# Patient Record
Sex: Female | Born: 1965
Health system: Southern US, Community
[De-identification: ages and names within clinical notes are randomized; demographics above are authoritative.]

## PROBLEM LIST (undated history)

## (undated) DIAGNOSIS — I213 ST elevation (STEMI) myocardial infarction of unspecified site: Secondary | ICD-10-CM

## (undated) DIAGNOSIS — K635 Polyp of colon: Secondary | ICD-10-CM

## (undated) DIAGNOSIS — Z95810 Presence of automatic (implantable) cardiac defibrillator: Secondary | ICD-10-CM

## (undated) DIAGNOSIS — I519 Heart disease, unspecified: Secondary | ICD-10-CM

## (undated) DIAGNOSIS — I2510 Atherosclerotic heart disease of native coronary artery without angina pectoris: Secondary | ICD-10-CM

## (undated) DIAGNOSIS — I5189 Other ill-defined heart diseases: Secondary | ICD-10-CM

## (undated) HISTORY — DX: Polyp of colon: K63.5

## (undated) HISTORY — PX: AUGMENTATION MAMMAPLASTY: SUR837

---

## 1998-01-30 HISTORY — PX: TUBAL LIGATION: SHX77

## 1998-07-08 ENCOUNTER — Encounter: Admission: RE | Admit: 1998-07-08 | Discharge: 1998-10-06 | Payer: Self-pay | Admitting: Obstetrics and Gynecology

## 1998-11-03 ENCOUNTER — Inpatient Hospital Stay (HOSPITAL_COMMUNITY): Admission: AD | Admit: 1998-11-03 | Discharge: 1998-11-05 | Payer: Self-pay | Admitting: Obstetrics and Gynecology

## 1998-11-05 ENCOUNTER — Encounter (HOSPITAL_COMMUNITY): Admission: RE | Admit: 1998-11-05 | Discharge: 1999-02-03 | Payer: Self-pay | Admitting: Obstetrics and Gynecology

## 1999-02-02 ENCOUNTER — Other Ambulatory Visit: Admission: RE | Admit: 1999-02-02 | Discharge: 1999-02-02 | Payer: Self-pay | Admitting: Obstetrics and Gynecology

## 2000-07-09 ENCOUNTER — Other Ambulatory Visit: Admission: RE | Admit: 2000-07-09 | Discharge: 2000-07-09 | Payer: Self-pay | Admitting: Obstetrics and Gynecology

## 2002-03-18 ENCOUNTER — Other Ambulatory Visit: Admission: RE | Admit: 2002-03-18 | Discharge: 2002-03-18 | Payer: Self-pay | Admitting: Obstetrics and Gynecology

## 2003-06-02 ENCOUNTER — Other Ambulatory Visit: Admission: RE | Admit: 2003-06-02 | Discharge: 2003-06-02 | Payer: Self-pay | Admitting: Obstetrics and Gynecology

## 2011-02-15 ENCOUNTER — Encounter: Payer: Self-pay | Admitting: Gastroenterology

## 2011-02-21 ENCOUNTER — Encounter: Payer: Self-pay | Admitting: Gastroenterology

## 2011-02-21 ENCOUNTER — Ambulatory Visit (INDEPENDENT_AMBULATORY_CARE_PROVIDER_SITE_OTHER): Payer: 59 | Admitting: Gastroenterology

## 2011-02-21 VITALS — BP 100/60 | HR 64 | Ht 60.0 in | Wt 130.4 lb

## 2011-02-21 DIAGNOSIS — K644 Residual hemorrhoidal skin tags: Secondary | ICD-10-CM

## 2011-02-21 DIAGNOSIS — K921 Melena: Secondary | ICD-10-CM

## 2011-02-21 NOTE — Progress Notes (Signed)
History of Present Illness: This is a 46 year old female who relates the onset of rectal pain and swelling that began 10 days ago. This was associated with small amounts of bright red blood per rectum. She presumed this was a hemorrhoid and began treatment with Advil, Preparation H cream and suppositories and her symptoms have steadily improved. She has a history of chronic constipation and takes MiraLax every 2-3 days and occasional stool softeners. She has had hemorrhoidal swelling and discomfort in the past however it had not been this severe. Denies weight loss, abdominal pain, constipation, diarrhea, change in stool caliber, melena, nausea, vomiting, dysphagia, reflux symptoms, chest pain.  Allergies  Allergen Reactions  . Monistat Other (See Comments)    Abrastion feeling   No outpatient prescriptions prior to visit.   Past Medical History  Diagnosis Date  . Hemorrhoids   . Vitamin D deficiency    Past Surgical History  Procedure Date  . Cesarean section 1993, 2000   History   Social History  . Marital Status: Single    Spouse Name: N/A    Number of Children: 2  . Years of Education: N/A   Occupational History  . Print production planner    Social History Main Topics  . Smoking status: Never Smoker   . Smokeless tobacco: Never Used  . Alcohol Use: No  . Drug Use: No  . Sexually Active: None   Other Topics Concern  . None   Social History Narrative  . None   Family History  Problem Relation Age of Onset  . Colon polyps Mother     Review of Systems: Pertinent positive and negative review of systems were noted in the above HPI section. All other review of systems were otherwise negative.   Physical Exam: General: Well developed , well nourished, no acute distress Head: Normocephalic and atraumatic Eyes:  sclerae anicteric, EOMI Ears: Normal auditory acuity Mouth: No deformity or lesions Neck: Supple, no masses or thyromegaly Lungs: Clear throughout to  auscultation Heart: Regular rate and rhythm; no murmurs, rubs or bruits Abdomen: Soft, non tender and non distended. No masses, hepatosplenomegaly or hernias noted. Normal Bowel sounds Rectal: Thrombosed external hemorrhoid is mildly tender, no internal lesions, Hemoccult-negative stool in the vault Musculoskeletal: Symmetrical with no gross deformities  Skin: No lesions on visible extremities Pulses:  Normal pulses noted Extremities: No clubbing, cyanosis, edema or deformities noted Neurological: Alert oriented x 4, grossly nonfocal Cervical Nodes:  No significant cervical adenopathy Inguinal Nodes: No significant inguinal adenopathy Psychological:  Alert and cooperative. Normal mood and affect  Assessment and Recommendations:  1. Hematochezia and a thrombosed external hemorrhoid. Begin Analpram HC cream twice a day along with standard rectal and hemorrhoidal care instructions. Rule out colorectal neoplasms leading to hematochezia but I suspect hematochezia is secondary to hemorrhoids. The risks, benefits, and alternatives to colonoscopy with possible biopsy, possible destruction of internal hemorrhoids and possible polypectomy were discussed with the patient and they consent to proceed.   2. Family history of colon polyps in her mother. Colonoscopy as above.  3. Chronic constipation. Continue current management with a high fiber diet and MiraLax as needed.

## 2011-02-21 NOTE — Patient Instructions (Addendum)
You have been scheduled for a Colonoscopy. See separate instructions. You have been given samples of Analpram to use rectally twice daily. Rectal care instructions given to patient. cc: [name], [place]

## 2011-02-22 ENCOUNTER — Encounter: Payer: Self-pay | Admitting: Gastroenterology

## 2011-02-22 ENCOUNTER — Ambulatory Visit (AMBULATORY_SURGERY_CENTER): Payer: 59 | Admitting: Gastroenterology

## 2011-02-22 DIAGNOSIS — D126 Benign neoplasm of colon, unspecified: Secondary | ICD-10-CM

## 2011-02-22 DIAGNOSIS — K635 Polyp of colon: Secondary | ICD-10-CM

## 2011-02-22 DIAGNOSIS — K921 Melena: Secondary | ICD-10-CM

## 2011-02-22 DIAGNOSIS — K644 Residual hemorrhoidal skin tags: Secondary | ICD-10-CM

## 2011-02-22 MED ORDER — SODIUM CHLORIDE 0.9 % IV SOLN: 500.0000 mL | INTRAVENOUS | Status: DC

## 2011-02-22 NOTE — Op Note (Signed)
[facility] [address] [place], [place]  [address]  COLONOSCOPY PROCEDURE REPORT  PATIENT:  Misty, Griffin  MR#:  [id number] BIRTHDATE:  12/24/1965, 45 yrs. old  GENDER:  female ENDOSCOPIST:  Judie Petit T. Russella Dar, MD, [facility]  PROCEDURE DATE:  02/22/2011 PROCEDURE:  Colonoscopy with polypectomy and submucosal injection ASA CLASS:  Class I INDICATIONS:  1) hematochezia MEDICATIONS:   MAC sedation, administered by CRNA, propofol (Diprivan) 250 mg IV DESCRIPTION OF PROCEDURE:   After the risks benefits and alternatives of the procedure were thoroughly explained, informed consent was obtained.  Digital rectal exam was performed and revealed no abnormalities.   The LB CF-[id number] endoscope was introduced through the anus and advanced to the cecum, which was identified by both the appendix and ileocecal valve, without limitations.  The quality of the prep was excellent, using MoviPrep.  The instrument was then slowly withdrawn as the colon was fully examined. <<PROCEDUREIMAGES>> FINDINGS:  A pedunculated polyp was found in the descending colon. It was 2 cm in size. Polyp was snared, then cauterized with monopolar cautery removed piecemeal. Retrieval was successful. Normal colon otherwise in the descending colon with submucosal injections of [place] ink 5 cm distal to polypectomy site. Otherwise normal colonoscopy without other polyps, masses, vascular ectasias, or inflammatory changes.   Retroflexed views in the rectum revealed internal hemorrhoids, small.  The time to cecum = 3.25  minutes. The scope was then withdrawn (time =  14.5  min) from the patient and the procedure completed.  COMPLICATIONS:  None  ENDOSCOPIC IMPRESSION: 1) 2 cm pedunculated polyp in the descending colon 2) Internal hemorrhoids  RECOMMENDATIONS: 1) Hold aspirin, aspirin products, and anti-inflammatory medication for 2 weeks. 2) Await pathology results 3) Repeat Colonoscopy in 1-2 years  pending pathology review.  Venita Lick. Russella Dar, MD, [name]  CC:  [name] PA  n. Rosalie DoctorVenita Lick. Stark at 02/22/2011 12:09 PM  Virgie Dad, [phone]

## 2011-02-22 NOTE — Progress Notes (Signed)
Patient did not experience any of the following events: a burn prior to discharge; a fall within the facility; wrong site/side/patient/procedure/implant event; or a hospital transfer or hospital admission upon discharge from the facility. [phone]) Patient did not have preoperative order for IV antibiotic SSI prophylaxis. [phone] Patient did not experience a hospital transfer or hospital admission upon discharge from [facility]. [phone])

## 2011-02-22 NOTE — Patient Instructions (Signed)
Resume all medications. Do Not Take any ASPIRIN PRODUCTS ADVIL OR ARTHRITIS MEDICATIONS FOR 2 WEEKS MAY USE TYLENOL.

## 2011-02-23 ENCOUNTER — Telehealth: Payer: Self-pay | Admitting: *Deleted

## 2011-02-23 NOTE — Telephone Encounter (Signed)
Left message to call us back

## 2011-02-28 ENCOUNTER — Encounter: Payer: Self-pay | Admitting: Gastroenterology

## 2012-04-23 DIAGNOSIS — I5189 Other ill-defined heart diseases: Secondary | ICD-10-CM

## 2012-04-23 HISTORY — DX: Other ill-defined heart diseases: I51.89

## 2013-01-10 ENCOUNTER — Encounter: Payer: Self-pay | Admitting: Gastroenterology

## 2013-03-30 HISTORY — PX: CORONARY ANGIOPLASTY WITH STENT PLACEMENT: SHX49

## 2013-04-22 ENCOUNTER — Encounter (HOSPITAL_COMMUNITY)
Admission: EM | Disposition: A | Discharge status: Home / Self Care | Payer: PRIVATE HEALTH INSURANCE | Source: Home / Self Care | Attending: Cardiology

## 2013-04-22 ENCOUNTER — Encounter (HOSPITAL_COMMUNITY): Payer: Self-pay | Admitting: Emergency Medicine

## 2013-04-22 ENCOUNTER — Emergency Department (HOSPITAL_COMMUNITY): Payer: PRIVATE HEALTH INSURANCE

## 2013-04-22 ENCOUNTER — Inpatient Hospital Stay (HOSPITAL_COMMUNITY)
Admission: EM | Admit: 2013-04-22 | Discharge: 2013-04-25 | DRG: 247 | Disposition: A | Discharge status: Home / Self Care | Payer: PRIVATE HEALTH INSURANCE | Attending: Cardiology | Admitting: Cardiology

## 2013-04-22 DIAGNOSIS — I2510 Atherosclerotic heart disease of native coronary artery without angina pectoris: Secondary | ICD-10-CM

## 2013-04-22 DIAGNOSIS — I213 ST elevation (STEMI) myocardial infarction of unspecified site: Secondary | ICD-10-CM | POA: Diagnosis present

## 2013-04-22 DIAGNOSIS — I2109 ST elevation (STEMI) myocardial infarction involving other coronary artery of anterior wall: Secondary | ICD-10-CM

## 2013-04-22 DIAGNOSIS — I959 Hypotension, unspecified: Secondary | ICD-10-CM | POA: Diagnosis not present

## 2013-04-22 DIAGNOSIS — I2102 ST elevation (STEMI) myocardial infarction involving left anterior descending coronary artery: Secondary | ICD-10-CM | POA: Diagnosis present

## 2013-04-22 DIAGNOSIS — I2589 Other forms of chronic ischemic heart disease: Secondary | ICD-10-CM | POA: Diagnosis present

## 2013-04-22 DIAGNOSIS — Z955 Presence of coronary angioplasty implant and graft: Secondary | ICD-10-CM

## 2013-04-22 DIAGNOSIS — I200 Unstable angina: Secondary | ICD-10-CM

## 2013-04-22 DIAGNOSIS — I2582 Chronic total occlusion of coronary artery: Secondary | ICD-10-CM | POA: Diagnosis present

## 2013-04-22 DIAGNOSIS — I255 Ischemic cardiomyopathy: Secondary | ICD-10-CM | POA: Diagnosis present

## 2013-04-22 HISTORY — PX: LEFT HEART CATHETERIZATION WITH CORONARY ANGIOGRAM: SHX5451

## 2013-04-22 HISTORY — DX: ST elevation (STEMI) myocardial infarction of unspecified site: I21.3

## 2013-04-22 HISTORY — DX: Heart disease, unspecified: I51.9

## 2013-04-22 HISTORY — DX: Atherosclerotic heart disease of native coronary artery without angina pectoris: I25.10

## 2013-04-22 HISTORY — DX: Other ill-defined heart diseases: I51.89

## 2013-04-22 HISTORY — PX: PERCUTANEOUS CORONARY STENT INTERVENTION (PCI-S): SHX5485

## 2013-04-22 LAB — I-STAT CHEM 8, ED
BUN: 13 mg/dL (ref 6–23)
Calcium, Ion: 1.1 mmol/L — ABNORMAL LOW (ref 1.12–1.23)
Chloride: 103 mEq/L (ref 96–112)
Creatinine, Ser: 0.8 mg/dL (ref 0.50–1.10)
Glucose, Bld: 152 mg/dL — ABNORMAL HIGH (ref 70–99)
HEMATOCRIT: 43 % (ref 36.0–46.0)
HEMOGLOBIN: 14.6 g/dL (ref 12.0–15.0)
Potassium: 3.5 mEq/L — ABNORMAL LOW (ref 3.7–5.3)
SODIUM: 141 meq/L (ref 137–147)
TCO2: 22 mmol/L (ref 0–100)

## 2013-04-22 LAB — CBC
HCT: 40.9 % (ref 36.0–46.0)
HCT: 34.7 % — ABNORMAL LOW (ref 36.0–46.0)
HEMOGLOBIN: 12.5 g/dL (ref 12.0–15.0)
Hemoglobin: 14.3 g/dL (ref 12.0–15.0)
MCH: 29.6 pg (ref 26.0–34.0)
MCH: 30.2 pg (ref 26.0–34.0)
MCHC: 35 g/dL (ref 30.0–36.0)
MCHC: 36 g/dL (ref 30.0–36.0)
MCV: 84.7 fL (ref 78.0–100.0)
MCV: 83.8 fL (ref 78.0–100.0)
PLATELETS: 229 10*3/uL (ref 150–400)
Platelets: 202 10*3/uL (ref 150–400)
RBC: 4.83 MIL/uL (ref 3.87–5.11)
RBC: 4.14 MIL/uL (ref 3.87–5.11)
RDW: 13 % (ref 11.5–15.5)
RDW: 13 % (ref 11.5–15.5)
WBC: 9.9 10*3/uL (ref 4.0–10.5)
WBC: 11.1 10*3/uL — ABNORMAL HIGH (ref 4.0–10.5)

## 2013-04-22 LAB — APTT: aPTT: 32 seconds (ref 24–37)

## 2013-04-22 LAB — I-STAT TROPONIN, ED: TROPONIN I, POC: 6.34 ng/mL — AB (ref 0.00–0.08)

## 2013-04-22 LAB — CREATININE, SERUM
CREATININE: 0.54 mg/dL (ref 0.50–1.10)
GFR calc Af Amer: >90 mL/min (ref >90)
GFR calc non Af Amer: >90 mL/min (ref >90)

## 2013-04-22 LAB — PROTIME-INR
INR: 0.94 (ref 0.00–1.49)
Prothrombin Time: 12.4 seconds (ref 11.6–15.2)

## 2013-04-22 SURGERY — LEFT HEART CATHETERIZATION WITH CORONARY ANGIOGRAM
Anesthesia: LOCAL

## 2013-04-22 MED ORDER — NITROGLYCERIN 0.4 MG SL SUBL: 0.4000 mg | SUBLINGUAL_TABLET | SUBLINGUAL | Status: DC | PRN

## 2013-04-22 MED ORDER — SODIUM CHLORIDE 0.9 % IV SOLN: 1.0000 mL/kg/h | INTRAVENOUS | Status: AC

## 2013-04-22 MED ORDER — ASPIRIN 81 MG PO CHEW
81.0000 mg | CHEWABLE_TABLET | Freq: Every day | ORAL | Status: DC
  Administered 2013-04-23 – 2013-04-25 (×3): 81 mg via ORAL
  Filled 2013-04-22 (×3): qty 1

## 2013-04-22 MED ORDER — ASPIRIN 81 MG PO CHEW
324.0000 mg | CHEWABLE_TABLET | Freq: Once | ORAL | Status: AC
  Administered 2013-04-22: 324 mg via ORAL
  Filled 2013-04-22: qty 4

## 2013-04-22 MED ORDER — ADULT MULTIVITAMIN W/MINERALS CH: 1.0000 | ORAL_TABLET | Freq: Every day | ORAL | Status: DC

## 2013-04-22 MED ORDER — MULTIPLE VITAMIN PO TABS: 1.0000 | ORAL_TABLET | Freq: Every day | ORAL | Status: DC

## 2013-04-22 MED ORDER — NITROGLYCERIN IN D5W 200-5 MCG/ML-% IV SOLN
5.0000 ug/min | Freq: Once | INTRAVENOUS | Status: DC
  Filled 2013-04-22: qty 250

## 2013-04-22 MED ORDER — TICAGRELOR 90 MG PO TABS
90.0000 mg | ORAL_TABLET | Freq: Two times a day (BID) | ORAL | Status: DC
  Administered 2013-04-22 – 2013-04-23 (×2): 90 mg via ORAL
  Filled 2013-04-22 (×3): qty 1

## 2013-04-22 MED ORDER — HEPARIN (PORCINE) IN NACL 100-0.45 UNIT/ML-% IJ SOLN
650.0000 [IU]/h | INTRAMUSCULAR | Status: DC
  Filled 2013-04-22: qty 250

## 2013-04-22 MED ORDER — SODIUM CHLORIDE 0.9 % IV SOLN
0.2500 mg/kg/h | INTRAVENOUS | Status: DC
  Filled 2013-04-22: qty 250

## 2013-04-22 MED ORDER — HEPARIN SODIUM (PORCINE) 5000 UNIT/ML IJ SOLN
5000.0000 [IU] | Freq: Three times a day (TID) | INTRAMUSCULAR | Status: DC
  Administered 2013-04-23 – 2013-04-25 (×7): 5000 [IU] via SUBCUTANEOUS
  Filled 2013-04-22 (×10): qty 1

## 2013-04-22 MED ORDER — METOPROLOL TARTRATE 12.5 MG HALF TABLET
12.5000 mg | ORAL_TABLET | Freq: Two times a day (BID) | ORAL | Status: DC
  Administered 2013-04-22 – 2013-04-25 (×6): 12.5 mg via ORAL
  Filled 2013-04-22 (×7): qty 1

## 2013-04-22 MED ORDER — HEPARIN BOLUS VIA INFUSION
3000.0000 [IU] | Freq: Once | INTRAVENOUS | Status: DC
  Filled 2013-04-22: qty 3000

## 2013-04-22 MED ORDER — ATORVASTATIN CALCIUM 80 MG PO TABS
80.0000 mg | ORAL_TABLET | Freq: Every day | ORAL | Status: DC
  Administered 2013-04-23 – 2013-04-24 (×2): 80 mg via ORAL
  Filled 2013-04-22 (×3): qty 1

## 2013-04-22 MED ORDER — HEPARIN SODIUM (PORCINE) 5000 UNIT/ML IJ SOLN: 60.0000 [IU]/kg | Freq: Once | INTRAMUSCULAR | Status: DC

## 2013-04-22 MED ORDER — ADULT MULTIVITAMIN W/MINERALS CH
1.0000 | ORAL_TABLET | Freq: Every day | ORAL | Status: DC
  Administered 2013-04-23 – 2013-04-25 (×3): 1 via ORAL
  Filled 2013-04-22 (×3): qty 1

## 2013-04-22 NOTE — Progress Notes (Signed)
ANTICOAGULATION CONSULT NOTE - Initial Consult  Pharmacy Consult for Heparin Indication: chest pain/ACS  Allergies  Allergen Reactions  . Miconazole Nitrate Other (See Comments)    Abrastion feeling    Patient Measurements: Height: 5' (152.4 cm) Weight: 132 lb (59.875 kg) IBW/kg (Calculated) : 45.5 Heparin Dosing Weight: 57 kg  Vital Signs: Temp: 98.3 F (36.8 C) (03/24 2003) Temp src: Oral (03/24 2003) BP: 148/118 mmHg (03/24 2015) Pulse Rate: 80 (03/24 2015)  Labs:  Recent Labs  04/22/13 2014  HGB 14.6  HCT 43.0  CREATININE 0.80    Estimated Creatinine Clearance: 71.2 ml/min (by C-G formula based on Cr of 0.8).   Medical History: Past Medical History  Diagnosis Date  . Hemorrhoids   . Vitamin D deficiency     Medications:  Scheduled:   Infusions:  . heparin     Followed by  . heparin      Assessment:  48 yr female with complaint of chest pain  Troponin = 6.34  IV Heparin to begin per pharmacy dosing with plans to transfer to [facility] for cath  Goal of Therapy:  Heparin level 0.3-0.7 units/ml Monitor platelets by anticoagulation protocol: Yes   Plan:   Obtain baseline PTT, PT/INR  Give heparin 3000 unit IV bolus x 1 followed by heparin infusion @ 650 units/hr  F/U after cath.  Will check heparin level 6 hrs after heparin started if pt not yet gone to cath lab  Yasira Engelson, Toribio Harbour, PharmD 04/22/2013,8:28 PM

## 2013-04-22 NOTE — ED Notes (Signed)
Patient states that pain began today. Patient went to her primary doctor and patient states that she was diagnosed with gerd and sent home.

## 2013-04-22 NOTE — ED Notes (Signed)
Dr Zenia Resides made aware of critical troponin level

## 2013-04-22 NOTE — H&P (Signed)
Cardiology History and Physical  DIXON,MARY BETH, PA-C  History of Present Illness (and review of medical records): Misty Griffin is a 48 y.o. female who presents for evaluation of chest pain.  She has no known hx of CAD or prior MI.  She denies HTN, DM, HLD or prior tobacco use.  She was driving to PCP today when had onset of 8/10 chest pain.  This was associated with N/V.  She was going for evaluation of prior chest pain she felt was heart burn.  She has been having this for the past 2-3 months.  Given her presentation today she was sent to [facility] ED for further evaluation.  EKG revealed anterior ST elevation.  CODE STEMI was activated.  She was given ASA, Nitro and Heparin bolus prior to transfer to [facility] cath lab.  Upon arrival patient remains with 6/10 chest pain.  Previous diagnostic testing for coronary artery disease includes: none. Previous history of cardiac disease includes None. Coronary artery disease risk factors include: none. Patient denies history of cardiomyopathy, pericarditis, previous M.I. and valvular disease.  Review of Systems Pertinent items are noted in HPI. Further review of systems was otherwise negative other than stated in HPI.  There are no active problems to display for this patient.  Past Medical History  Diagnosis Date  . Hemorrhoids   . Vitamin D deficiency     Past Surgical History  Procedure Laterality Date  . Cesarean section  1993, 2000    Prescriptions prior to admission  Medication Sig Dispense Refill  . MULTIPLE VITAMIN PO Take by mouth.       Allergies  Allergen Reactions  . Miconazole Nitrate Other (See Comments)    Abrastion feeling    History  Substance Use Topics  . Smoking status: Never Smoker   . Smokeless tobacco: Never Used  . Alcohol Use: No    Family History  Problem Relation Age of Onset  . Colon polyps Mother      Objective:  Patient Vitals for the past 8 hrs:  BP Temp Temp src Pulse Resp SpO2 Height Weight  04/22/13  2022 - - - - - - - 59.875 kg (132 lb)  04/22/13 2020 - - - - - - 5' (1.524 m) -  04/22/13 2015 148/118 mmHg - - 80 17 100 % - -  04/22/13 2012 - - - - - 100 % - -  04/22/13 2003 140/99 mmHg 98.3 F (36.8 C) Oral - 11 100 % - -   General appearance: alert, cooperative, appears stated age and no distress Head: Normocephalic, without obvious abnormality, atraumatic Eyes: conjunctivae/corneas clear. PERRL, EOM's intact. Fundi benign. Neck: no carotid bruit, no JVD and supple, symmetrical, trachea midline Lungs: clear to auscultation bilaterally Chest wall: no tenderness Heart: regular rate and rhythm, S1, S2 normal, no murmur, click, rub or gallop Abdomen: soft, non-tender; bowel sounds normal; no masses,  no organomegaly Extremities: extremities normal, atraumatic, no cyanosis or edema Pulses: 2+ and symmetric Neurologic: Grossly normal  Results for orders placed during the hospital encounter of 04/22/13 (from the past 48 hour(s))  CBC     Status: None   Collection Time    04/22/13  8:10 PM      Result Value Ref Range   WBC 9.9  4.0 - 10.5 K/uL   RBC 4.83  3.87 - 5.11 MIL/uL   Hemoglobin 14.3  12.0 - 15.0 g/dL   HCT 40.9  36.0 - 46.0 %   MCV 84.7  78.0 - 100.0 fL   MCH 29.6  26.0 - 34.0 pg   MCHC 35.0  30.0 - 36.0 g/dL   RDW 13.0  11.5 - 15.5 %   Platelets 229  150 - 400 K/uL  APTT     Status: None   Collection Time    04/22/13  8:10 PM      Result Value Ref Range   aPTT 32  24 - 37 seconds  PROTIME-INR     Status: None   Collection Time    04/22/13  8:10 PM      Result Value Ref Range   Prothrombin Time 12.4  11.6 - 15.2 seconds   INR 0.94  0.00 - 1.49  I-STAT TROPOININ, ED     Status: Abnormal   Collection Time    04/22/13  8:12 PM      Result Value Ref Range   Troponin i, poc 6.34 (*) 0.00 - 0.08 ng/mL   Comment NOTIFIED PHYSICIAN     Comment 3            Comment: Due to the release kinetics of cTnI,     a negative result within the first hours     of the onset of  symptoms does not rule out     myocardial infarction with certainty.     If myocardial infarction is still suspected,     repeat the test at appropriate intervals.  I-STAT CHEM 8, ED     Status: Abnormal   Collection Time    04/22/13  8:14 PM      Result Value Ref Range   Sodium 141  137 - 147 mEq/L   Potassium 3.5 (*) 3.7 - 5.3 mEq/L   Chloride 103  96 - 112 mEq/L   BUN 13  6 - 23 mg/dL   Creatinine, Ser 0.80  0.50 - 1.10 mg/dL   Glucose, Bld 152 (*) 70 - 99 mg/dL   Calcium, Ion 1.10 (*) 1.12 - 1.23 mmol/L   TCO2 22  0 - 100 mmol/L   Hemoglobin 14.6  12.0 - 15.0 g/dL   HCT 43.0  36.0 - 46.0 %    ECG:  HR 84 sinus rhythm, ST elevation in V1-V4 with anterior Q waves  Assessment: ACS/Anterior STEMI  Plan:  1. Emergent cardiac catheterization possible PCI. 2. No contraindications for DAPT. 3. Admission to CCU post procedure 4. Continuous monitoring on Telemetry. 5. Repeat ekg on admit, prn chest pain or arrythmia 6. Trend cardiac biomarkers, check lipids, hgba1c, tsh 7. Medical management to include ASA,BB, Statin, NTG prn

## 2013-04-22 NOTE — ED Notes (Signed)
Report called to Dustin Acres in cath lab at [facility].

## 2013-04-22 NOTE — ED Provider Notes (Signed)
CSN: [id number]     Arrival date & time 04/22/13  1950 History   First MD Initiated Contact with Patient 04/22/13 2002     Chief Complaint  Patient presents with  . Chest Pain     (Consider location/radiation/quality/duration/timing/severity/associated sxs/prior Treatment) Patient is a 48 y.o. female presenting with chest pain. The history is provided by the patient and a relative.  Chest Pain  patient here complaining of substernal chest pain that began on Saturday. Symptoms have been waxing and waning and last for approximately to 20 minutes. No associated diaphoresis or dyspnea. These are worse when she eats. Saw her physician today and diagnosed with GERD. No EKG was done according to the patient. Continue to have chest discomfort with associated vomiting. No radiation to her neck her arm. No medications taken for this. She has no prior medical history but does have a very strong family history of coronary disease.  Past Medical History  Diagnosis Date  . Hemorrhoids   . Vitamin D deficiency    Past Surgical History  Procedure Laterality Date  . Cesarean section  1993, 2000   Family History  Problem Relation Age of Onset  . Colon polyps Mother    History  Substance Use Topics  . Smoking status: Never Smoker   . Smokeless tobacco: Never Used  . Alcohol Use: No   OB History   Grav Para Term Preterm Abortions TAB SAB Ect Mult Living                 Review of Systems  Cardiovascular: Positive for chest pain.  All other systems reviewed and are negative.      Allergies  Miconazole nitrate  Home Medications   Current Outpatient Rx  Name  Route  Sig  Dispense  Refill  . MULTIPLE VITAMIN PO   Oral   Take by mouth.          BP 140/99  Temp(Src) 98.3 F (36.8 C) (Oral)  Resp 11  SpO2 100%  LMP 01/22/2013 Physical Exam  Nursing note and vitals reviewed. Constitutional: She is oriented to person, place, and time. She appears well-developed and  well-nourished.  Non-toxic appearance. No distress.  HENT:  Head: Normocephalic and atraumatic.  Eyes: Conjunctivae, EOM and lids are normal. Pupils are equal, round, and reactive to light.  Neck: Normal range of motion. Neck supple. No tracheal deviation present. No mass present.  Cardiovascular: Normal rate, regular rhythm and normal heart sounds.  Exam reveals no gallop.   No murmur heard. Pulmonary/Chest: Effort normal and breath sounds normal. No stridor. No respiratory distress. She has no decreased breath sounds. She has no wheezes. She has no rhonchi. She has no rales.  Abdominal: Soft. Normal appearance and bowel sounds are normal. She exhibits no distension. There is no tenderness. There is no rebound and no CVA tenderness.  Musculoskeletal: Normal range of motion. She exhibits no edema and no tenderness.  Neurological: She is alert and oriented to person, place, and time. She has normal strength. No cranial nerve deficit or sensory deficit. GCS eye subscore is 4. GCS verbal subscore is 5. GCS motor subscore is 6.  Skin: Skin is warm and dry. No abrasion and no rash noted.  Psychiatric: She has a normal mood and affect. Her speech is normal and behavior is normal.    ED Course  Procedures (including critical care time) Labs Review Labs Reviewed  CBC  I-STAT CHEM 8, ED  Randolm Idol, ED   Imaging  Review No results found.   EKG Interpretation None      MDM   Final diagnoses:  None     Date: 04/22/2013  Rate: 94  Rhythm: normal sinus rhythm  QRS Axis: normal  Intervals: normal  ST/T Wave abnormalities: ST elevations anteriorly  Conduction Disutrbances:none  Narrative Interpretation:   Old EKG Reviewed: none available  Code stemi called after I reviewed the patient's EKG. Cardiac consultation with Dr. Martinique obtained. He has reviewed EKG he agrees with the interpretation. Patient continues to note mild chest pain. We'll start nitroglycerin drip. Heparin bolus  to be given. Patient given aspirin here. Will be transferred to the Cath Lab at      Leota Jacobsen, MD 04/22/13 2018

## 2013-04-22 NOTE — ED Notes (Signed)
Pt presents with c/o chest pain. Pt was seen earlier today at her doctor and was diagnosed with GERD. Pt says she has vomited twice today. Pt says the pain is in the center of her  chest and she says that her left arm also feels weird.

## 2013-04-22 NOTE — CV Procedure (Signed)
    Cardiac Catheterization Procedure Note  Name: Misty Griffin MRN: [id number] DOB: Jul 01, 1965  Procedure: Left Heart Cath, Selective Coronary Angiography, LV angiography, PTCA and stenting of the LAD  Indication: 48 yo WF with no prior cardiac history presents with an acute anterior STEMI. ST elevation 3 mm in lead V2 with 1-2 mm in leads V1 and V3.   Procedural Details:  The right wrist was prepped, draped, and anesthetized with 1% lidocaine. Using the modified Seldinger technique, a 6 French slender sheath was introduced into the right radial artery. 3 mg of verapamil was administered through the sheath, weight-based unfractionated heparin was administered intravenously. Standard Judkins catheters were used for selective coronary angiography and left ventriculography. Catheter exchanges were performed over an exchange length guidewire.  PROCEDURAL FINDINGS Hemodynamics: AO 100/70 mean 85 mm Hg LV 96/15 mm Hg   Coronary angiography: Coronary dominance: right  Left mainstem: Normal.  Left anterior descending (LAD): The LAD is occluded proximally with TIMI 0 flow and no collaterals.   There is a very small Ramus intermediate branch with a 99% stenosis proximally.  Left circumflex (LCx): Normal.  Right coronary artery (RCA): mild focal disease in the mid vessel to 20%.  Left ventriculography: Left ventricular systolic function is abnormal, LVEF is estimated at 30-35%, there is severe hypokinesis of the mid anterior wall with akinesis of the distal anterior wall and dyskinesis of the apex. There is no significant mitral regurgitation   PCI Note:  Following the diagnostic procedure, the decision was made to proceed with PCI of the LAD. Brilinta 180 mg was given orally. Weight-based bivalirudin was given for anticoagulation. Once a therapeutic ACT was achieved, a 6 [place] XBLAD 3.5 guide catheter was inserted.  A prowater coronary guidewire was used to cross the lesion.  The lesion  was predilated with a 2.5 mm  balloon.  The lesion was then stented with a 3.0 x 16 mm Promus stent.   Following PCI, there was 0% residual stenosis and TIMI-3 flow. The mid to distal LAD was very small and tapering. The first diagonal is moderate in size and normal. Final angiography confirmed an excellent result. The patient tolerated the procedure well. There were no immediate procedural complications. A TR band was used for radial hemostasis. The patient was transferred to the post catheterization recovery area for further monitoring.  PCI Data: Vessel - LAD/Segment - proximal Percent Stenosis (pre)  100% TIMI-flow 0 Stent 3.0 x 16 mm Promus Percent Stenosis (post) 0% TIMI-flow (post) 3  Final Conclusions:   1. 2 vessel obstructive CAD with occluded proximal LAD. She also has severe disease in a very small Ramus branch that is too small for intervention. 2. Severe LV dysfunction. 3. Successful stenting of the proximal LAD with a DES.  Recommendations:  Dual antiplatelet therapy for at least one year. Aggressive risk factor modification. Will continue low dose bivalirudin until bottle complete.   Collier [facility] 04/22/2013, 9:49 PM

## 2013-04-23 ENCOUNTER — Encounter: Payer: Self-pay | Admitting: Cardiovascular Disease

## 2013-04-23 ENCOUNTER — Inpatient Hospital Stay (HOSPITAL_COMMUNITY): Payer: PRIVATE HEALTH INSURANCE

## 2013-04-23 DIAGNOSIS — I059 Rheumatic mitral valve disease, unspecified: Secondary | ICD-10-CM

## 2013-04-23 DIAGNOSIS — I2109 ST elevation (STEMI) myocardial infarction involving other coronary artery of anterior wall: Principal | ICD-10-CM

## 2013-04-23 LAB — CBC
HCT: 34 % — ABNORMAL LOW (ref 36.0–46.0)
HCT: 35.5 % — ABNORMAL LOW (ref 36.0–46.0)
HEMOGLOBIN: 12.1 g/dL (ref 12.0–15.0)
Hemoglobin: 12.5 g/dL (ref 12.0–15.0)
MCH: 30.3 pg (ref 26.0–34.0)
MCH: 30 pg (ref 26.0–34.0)
MCHC: 35.6 g/dL (ref 30.0–36.0)
MCHC: 35.2 g/dL (ref 30.0–36.0)
MCV: 85 fL (ref 78.0–100.0)
MCV: 85.3 fL (ref 78.0–100.0)
PLATELETS: 191 10*3/uL (ref 150–400)
Platelets: 191 10*3/uL (ref 150–400)
RBC: 4 MIL/uL (ref 3.87–5.11)
RBC: 4.16 MIL/uL (ref 3.87–5.11)
RDW: 13.3 % (ref 11.5–15.5)
RDW: 13.4 % (ref 11.5–15.5)
WBC: 8.6 10*3/uL (ref 4.0–10.5)
WBC: 8 10*3/uL (ref 4.0–10.5)

## 2013-04-23 LAB — BASIC METABOLIC PANEL
BUN: 10 mg/dL (ref 6–23)
BUN: 10 mg/dL (ref 6–23)
CHLORIDE: 103 meq/L (ref 96–112)
CO2: 20 meq/L (ref 19–32)
CO2: 23 mEq/L (ref 19–32)
Calcium: 8.6 mg/dL (ref 8.4–10.5)
Calcium: 9.1 mg/dL (ref 8.4–10.5)
Chloride: 104 mEq/L (ref 96–112)
Creatinine, Ser: 0.52 mg/dL (ref 0.50–1.10)
Creatinine, Ser: 0.56 mg/dL (ref 0.50–1.10)
GFR calc Af Amer: >90 mL/min (ref >90)
GFR calc non Af Amer: >90 mL/min (ref >90)
GFR calc non Af Amer: >90 mL/min (ref >90)
GLUCOSE: 108 mg/dL — AB (ref 70–99)
Glucose, Bld: 102 mg/dL — ABNORMAL HIGH (ref 70–99)
POTASSIUM: 4.2 meq/L (ref 3.7–5.3)
Potassium: 3.8 mEq/L (ref 3.7–5.3)
Sodium: 139 mEq/L (ref 137–147)
Sodium: 140 mEq/L (ref 137–147)

## 2013-04-23 LAB — POCT ACTIVATED CLOTTING TIME: ACTIVATED CLOTTING TIME: 415 s

## 2013-04-23 LAB — LIPID PANEL
Cholesterol: 174 mg/dL (ref 0–200)
HDL: 68 mg/dL (ref >39)
LDL Cholesterol: 95 mg/dL (ref 0–99)
Total CHOL/HDL Ratio: 2.6 RATIO
Triglycerides: 53 mg/dL (ref <150)
VLDL: 11 mg/dL (ref 0–40)

## 2013-04-23 LAB — HEMOGLOBIN A1C
HEMOGLOBIN A1C: 5.5 % (ref <5.7)
MEAN PLASMA GLUCOSE: 111 mg/dL (ref <117)

## 2013-04-23 LAB — TROPONIN I
Troponin I: >20 ng/mL (ref <0.30)
Troponin I: >20 ng/mL (ref <0.30)

## 2013-04-23 LAB — PROTIME-INR
INR: 1.02 (ref 0.00–1.49)
Prothrombin Time: 13.2 seconds (ref 11.6–15.2)

## 2013-04-23 LAB — MRSA PCR SCREENING: MRSA by PCR: NEGATIVE

## 2013-04-23 LAB — MAGNESIUM: MAGNESIUM: 1.9 mg/dL (ref 1.5–2.5)

## 2013-04-23 MED ORDER — LISINOPRIL 2.5 MG PO TABS
2.5000 mg | ORAL_TABLET | Freq: Every day | ORAL | Status: DC
Start: 2013-04-23 — End: 2013-04-24
  Administered 2013-04-23: 2.5 mg via ORAL
  Filled 2013-04-23 (×2): qty 1

## 2013-04-23 MED ORDER — TICAGRELOR 90 MG PO TABS
90.0000 mg | ORAL_TABLET | Freq: Two times a day (BID) | ORAL | Status: AC
  Administered 2013-04-23: 90 mg via ORAL
  Filled 2013-04-23: qty 1

## 2013-04-23 MED ORDER — PRASUGREL HCL 10 MG PO TABS
10.0000 mg | ORAL_TABLET | Freq: Every day | ORAL | Status: DC
  Administered 2013-04-24 – 2013-04-25 (×2): 10 mg via ORAL
  Filled 2013-04-23 (×2): qty 1

## 2013-04-23 MED FILL — Verapamil HCl IV Soln 2.5 MG/ML: INTRAVENOUS | Qty: 2 | Status: AC

## 2013-04-23 MED FILL — Ticagrelor Tab 90 MG: ORAL | Qty: 1 | Status: AC

## 2013-04-23 MED FILL — Bivalirudin Trifluoroacetate For IV Soln 250 MG (Base Equiv): INTRAVENOUS | Qty: 250 | Status: AC

## 2013-04-23 MED FILL — Sodium Chloride IV Soln 0.9%: INTRAVENOUS | Qty: 50 | Status: AC

## 2013-04-23 MED FILL — Lidocaine HCl Local Preservative Free (PF) Inj 1%: INTRAMUSCULAR | Qty: 30 | Status: AC

## 2013-04-23 MED FILL — Heparin Sodium (Porcine) 2 Unit/ML in Sodium Chloride 0.9%: INTRAMUSCULAR | Qty: 1500 | Status: AC

## 2013-04-23 MED FILL — Nitroglycerin IV Soln 200 MCG/ML in D5W: INTRAVENOUS | Qty: 1 | Status: AC

## 2013-04-23 NOTE — Progress Notes (Signed)
CARDIAC REHAB PHASE I   PRE:  Rate/Rhythm: 68 SR    BP: sitting 100/71    SaO2: 100 RA  MODE:  Ambulation: 740 ft   POST:  Rate/Rhythm: 92 SR    BP: sitting 121/97, 128/92, 20 min later 106/78     SaO2:   Pt steady, sts she feels better when out of bed and walking. Kept slow pace however wants to walk many laps. Instructed pt to keep a balance. BP elevated after walk, see above. Pt had walked with husband 2 laps before lunch. Began ed. Encouraged to walk x1 more tonight but to not overdo. Will f/u tomorrow.  [place], [place], ACSM 04/23/2013 3:02 PM

## 2013-04-23 NOTE — Progress Notes (Signed)
Morning EKG showed increased ST elevation in V leads. Pt asymptomatic, VS stable.  Quenton Fetter, PA-C, notified via phone. Dr Martinique notified in person.  No orders received. Will continue to monitor.

## 2013-04-23 NOTE — Progress Notes (Signed)
EKG CRITICAL VALUE     12 lead EKG performed.  Critical value noted. Bufford Buttner , RN notified.    Gal Feldhaus, CCT 04/23/2013 8:02 AM

## 2013-04-23 NOTE — Care Management Note (Addendum)
    Page 1 of 1   04/23/2013     1:25:02 PM   CARE MANAGEMENT NOTE 04/23/2013  Patient:  Misty Griffin, Misty Griffin   Account Number:  [id number]  Date Initiated:  04/23/2013  Documentation initiated by:  Elissa Hefty  Subjective/Objective Assessment:   adm w mi     Action/Plan:   lives w fam , pcp dr Dena Billet   DC Planning Services  CM consult  Medication Assistance      Discharge Disposition:  HOME/SELF CARE  Per UR Regulation:  Reviewed for med. necessity/level of care/duration of stay  Comments:  04/23/13 [place] MSN BSN CCM Brillinta requires prior authorization and cost will be $60 for 30-day supply, $180 for 90-day supply.  Talked with pt who said she would have a hard time managing that copay. Per cardiologist, pt will be discharged on Effient. Effient does not require prior auth and cost will be $30 for 30-day supply and $90 for 90-day supply.  Provided pt with 30-day free card and copay assist card.  3/25 0929 debbie dowell rn,bsn spoke w pt. she has med[employer] ins. gave her 30day brilinta free card and copay assist card.

## 2013-04-23 NOTE — Progress Notes (Signed)
Echo Lab  2D Echocardiogram completed.  [place], RDCS 04/23/2013 9:03 AM

## 2013-04-23 NOTE — Progress Notes (Signed)
Patient Name: Misty Griffin Date of Encounter: 04/23/2013  Active Problems:   STEMI (ST elevation myocardial infarction)   ST elevation myocardial infarction (STEMI) involving left anterior descending (LAD) coronary artery with complication   Length of Stay: 1  SUBJECTIVE  Minor retrosternal discomfort, worse with deep breath and distinct from angina No dyspnea or dizziness. BP relatively low.  CURRENT MEDS . aspirin  81 mg Oral Daily  . atorvastatin  80 mg Oral q1800  . heparin  5,000 Units Subcutaneous 3 times per day  . lisinopril  2.5 mg Oral Daily  . metoprolol tartrate  12.5 mg Oral BID  . multivitamin with minerals  1 tablet Oral Daily  . nitroGLYCERIN  5 mcg/min Intravenous Once  . Ticagrelor  90 mg Oral BID    OBJECTIVE   Intake/Output Summary (Last 24 hours) at 04/23/13 0844 Last data filed at 04/23/13 0815  Gross per 24 hour  Intake  431.3 ml  Output    400 ml  Net   31.3 ml   Filed Weights   04/22/13 2022  Weight: 59.875 kg (132 lb)    PHYSICAL EXAM Filed Vitals:   04/23/13 0500 04/23/13 0600 04/23/13 0700 04/23/13 0815  BP: 92/72 91/68 90/68  94/67  Pulse: 55 50 54 64  Temp:   98.9 F (37.2 C)   TempSrc:   Oral   Resp: 11 14 14 17   Height:      Weight:      SpO2: 99% 98% 99% 100%   General: Alert, oriented x3, no distress Head: no evidence of trauma, PERRL, EOMI, no exophtalmos or lid lag, no myxedema, no xanthelasma; normal ears, nose and oropharynx Neck: normal jugular venous pulsations and no hepatojugular reflux; brisk carotid pulses without delay and no carotid bruits Chest: clear to auscultation, no signs of consolidation by percussion or palpation, normal fremitus, symmetrical and full respiratory excursions Cardiovascular: normal position and quality of the apical impulse, regular rhythm, normal first and second heart sounds, no rubs or gallops, no murmur Abdomen: no tenderness or distention, no masses by palpation, no abnormal  pulsatility or arterial bruits, normal bowel sounds, no hepatosplenomegaly Extremities: no clubbing, cyanosis or edema; 2+ radial, ulnar and brachial pulses bilaterally; 2+ right femoral, posterior tibial and dorsalis pedis pulses; 2+ left femoral, posterior tibial and dorsalis pedis pulses; no subclavian or femoral bruits Neurological: grossly nonfocal  LABS  CBC  Recent Labs  04/22/13 2315 04/23/13 0445  WBC 11.1* 8.6  HGB 12.5 12.1  HCT 34.7* 34.0*  MCV 83.8 85.0  PLT 202 829   Basic Metabolic Panel  Recent Labs  04/22/13 2014 04/22/13 2315 04/23/13 0445  NA 141  --  139  K 3.5*  --  3.8  CL 103  --  104  CO2  --   --  20  GLUCOSE 152*  --  108*  BUN 13  --  10  CREATININE 0.80 0.54 0.52  CALCIUM  --   --  8.6  MG  --   --  1.9   Liver Function Tests No results found for this basename: AST, ALT, ALKPHOS, BILITOT, PROT, ALBUMIN,  in the last 72 hours No results found for this basename: LIPASE, AMYLASE,  in the last 72 hours Cardiac Enzymes  Recent Labs  04/22/13 2315 04/23/13 0445  TROPONINI >20.00* >20.00*   BNP No components found with this basename: POCBNP,  D-Dimer No results found for this basename: DDIMER,  in the last 72 hours Hemoglobin A1C  No results found for this basename: HGBA1C,  in the last 72 hours Fasting Lipid Panel  Recent Labs  04/23/13 0445  CHOL 174  HDL 68  LDLCALC 95  TRIG 53  CHOLHDL 2.6    Radiology Studies Imaging results have been reviewed and No results found.  TELE No ventricular arrhythmia  ECG Complete loss of precordial R waves, evolving ST-T changes with considerable residual ST elevation  ASSESSMENT AND PLAN  Large, completed anterior MI with delayed revascularization. Suspect mild postinfarction pericarditis. Monitor for HF and ventricular arrhythmia. None so far- keep in ICU another 24h. Echo pending. May need to consider Life Vest if EF<35%. Discussed mandatory role of dual antiplatelet therapy,  beta blockers and ACE inh in max tolerated doses Atorvastatin 80 mg ordered, may be excessive with her baseline LDL/HDL profile.   Sanda Klein, MD, [facility] [phone] office [phone] pager 04/23/2013 8:44 AM

## 2013-04-24 NOTE — Progress Notes (Signed)
Patient Name: Misty Griffin Date of Encounter: 04/24/2013  Active Problems:   STEMI (ST elevation myocardial infarction)   ST elevation myocardial infarction (STEMI) involving left anterior descending (LAD) coronary artery with complication   Length of Stay: 2  SUBJECTIVE  No angina or dyspnea. No ventricular arrhythmia. EF better than expected on echo  CURRENT MEDS . aspirin  81 mg Oral Daily  . atorvastatin  80 mg Oral q1800  . heparin  5,000 Units Subcutaneous 3 times per day  . metoprolol tartrate  12.5 mg Oral BID  . multivitamin with minerals  1 tablet Oral Daily  . nitroGLYCERIN  5 mcg/min Intravenous Once  . prasugrel  10 mg Oral Daily    OBJECTIVE   Intake/Output Summary (Last 24 hours) at 04/24/13 0819 Last data filed at 04/23/13 2300  Gross per 24 hour  Intake    820 ml  Output   1350 ml  Net   -530 ml   Filed Weights   04/22/13 2022  Weight: 59.875 kg (132 lb)    PHYSICAL EXAM Filed Vitals:   04/24/13 0400 04/24/13 0416 04/24/13 0500 04/24/13 0600  BP:  80/41 73/54   Pulse:      Temp:  99.2 F (37.3 C)    TempSrc:  Oral    Resp: 22 15 18 19   Height:      Weight:      SpO2:  100%     General: Alert, oriented x3, no distress Head: no evidence of trauma, PERRL, EOMI, no exophtalmos or lid lag, no myxedema, no xanthelasma; normal ears, nose and oropharynx Neck: normal jugular venous pulsations and no hepatojugular reflux; brisk carotid pulses without delay and no carotid bruits Chest: clear to auscultation, no signs of consolidation by percussion or palpation, normal fremitus, symmetrical and full respiratory excursions Cardiovascular: normal position and quality of the apical impulse, regular rhythm, normal first and second heart sounds, no rubs or gallops, no murmur Abdomen: no tenderness or distention, no masses by palpation, no abnormal pulsatility or arterial bruits, normal bowel sounds, no hepatosplenomegaly Extremities: no clubbing,  cyanosis or edema; 2+ radial, ulnar and brachial pulses bilaterally; 2+ right femoral, posterior tibial and dorsalis pedis pulses; 2+ left femoral, posterior tibial and dorsalis pedis pulses; no subclavian or femoral bruits Neurological: grossly nonfocal  LABS  CBC  Recent Labs  04/23/13 0445 04/23/13 1020  WBC 8.6 8.0  HGB 12.1 12.5  HCT 34.0* 35.5*  MCV 85.0 85.3  PLT 191 497   Basic Metabolic Panel  Recent Labs  04/22/13 2014  04/23/13 0445 04/23/13 1020  NA 141  --  139 140  K 3.5*  --  3.8 4.2  CL 103  --  104 103  CO2  --   --  20 23  GLUCOSE 152*  --  108* 102*  BUN 13  --  10 10  CREATININE 0.80  < > 0.52 0.56  CALCIUM  --   --  8.6 9.1  MG  --   --  1.9  --   < > = values in this interval not displayed. Liver Function Tests No results found for this basename: AST, ALT, ALKPHOS, BILITOT, PROT, ALBUMIN,  in the last 72 hours No results found for this basename: LIPASE, AMYLASE,  in the last 72 hours Cardiac Enzymes  Recent Labs  04/22/13 2315 04/23/13 0445 04/23/13 1020  TROPONINI >20.00* >20.00* >20.00*   BNP No components found with this basename: POCBNP,  D-Dimer No results  found for this basename: DDIMER,  in the last 72 hours Hemoglobin A1C  Recent Labs  04/22/13 2315  HGBA1C 5.5   Fasting Lipid Panel  Recent Labs  04/23/13 0445  CHOL 174  HDL 68  LDLCALC 95  TRIG 53  CHOLHDL 2.6   Thyroid Function Tests No results found for this basename: TSH, T4TOTAL, FREET3, T3FREE, THYROIDAB,  in the last 72 hours  Radiology Studies Imaging results have been reviewed and Dg Chest 2 View  04/23/2013   CLINICAL DATA:  Acute MI  EXAM: CHEST  2 VIEW  COMPARISON:  None.  FINDINGS: The lungs are adequately inflated. There is no focal infiltrate. The pulmonary vascularity is not engorged. The cardiac silhouette is normal in size. The mediastinum is normal in width. There is no pleural effusion. The trachea is midline. The observed portions of the bony  thorax exhibit no acute abnormalities.  IMPRESSION: There is no evidence of CHF nor other active cardiopulmonary disease.   Electronically Signed   By: David  Martinique   On: 04/23/2013 09:19    TELE No arrhythmia  ECG Very deep inverted T waves and marked QT prolongation suggest extensive stunning/viability, hopefully  ASSESSMENT AND PLAN Will not tolerate ACEi due to low BP. Transfer telemetry. Possible DC in AM.   Sanda Klein, MD, [facility] [phone] office [phone] pager 04/24/2013 8:19 AM

## 2013-04-24 NOTE — Progress Notes (Signed)
CARDIAC REHAB PHASE I   PRE:  Rate/Rhythm: 85 SR  BP:  Supine: 81/54  Sitting: 98/69  Standing:    SaO2:   MODE:  Ambulation: 1090 ft   POST:  Rate/Rhythm: 102 ST  BP:  Supine: 101/79  Sitting:   Standing:    SaO2:  1000-1055 Pt walked 1090 ft with steady gait. Tolerated well. No CP. Did state that left arm felt a little funny like BP cuff on. Nothing like what was felt when admitted. Education completed re MI and CHF. Reviewed signs, symptoms of CHF and importance of daily weights, low sodium handouts given. Discussed CRP 2 and pt gave permission to refer to [facility]. No dizziness during walk.   Graylon Good, RN BSN  04/24/2013 10:49 AM

## 2013-04-25 ENCOUNTER — Encounter (HOSPITAL_COMMUNITY): Payer: Self-pay | Admitting: Cardiology

## 2013-04-25 DIAGNOSIS — I2589 Other forms of chronic ischemic heart disease: Secondary | ICD-10-CM

## 2013-04-25 DIAGNOSIS — I255 Ischemic cardiomyopathy: Secondary | ICD-10-CM | POA: Diagnosis present

## 2013-04-25 DIAGNOSIS — I2510 Atherosclerotic heart disease of native coronary artery without angina pectoris: Secondary | ICD-10-CM | POA: Diagnosis present

## 2013-04-25 MED ORDER — METOPROLOL TARTRATE 25 MG PO TABS: 12.5000 mg | ORAL_TABLET | Freq: Two times a day (BID) | ORAL | Status: DC

## 2013-04-25 MED ORDER — PRASUGREL HCL 10 MG PO TABS: 10.0000 mg | ORAL_TABLET | Freq: Every day | ORAL | Status: DC

## 2013-04-25 MED ORDER — ATORVASTATIN CALCIUM 80 MG PO TABS: 80.0000 mg | ORAL_TABLET | Freq: Every day | ORAL | Status: DC

## 2013-04-25 MED ORDER — ASPIRIN 81 MG PO CHEW: 81.0000 mg | CHEWABLE_TABLET | Freq: Every day | ORAL | Status: AC

## 2013-04-25 MED ORDER — NITROGLYCERIN 0.4 MG SL SUBL: 0.4000 mg | SUBLINGUAL_TABLET | SUBLINGUAL | Status: DC | PRN

## 2013-04-25 NOTE — Progress Notes (Signed)
Patient Profile: 48 y/o female with no prior cardiac history who was admitted 04/22/13 for STEMI, found to have a 100% occulusion of proximal LAD, treated with PCI + DES and severe disease in a very small Ramus branch, too small for intervention. Moderate LV dysfunction on post STEMI echo w/ an EF of 40-45%.   On DAPT w/ ASA + Effient, Lopressor and Lipitor. Intolerable to ACE-I therapy, currently, due to hypotension.   Subjective: Denies further chest/ arm pain. No SOB. She denies feeling dizzy or lightheaded. She has been ambulating w/ cardiac rehab w/o angina and dyspnea. No right wrist pain.   Objective: Vital signs in last 24 hours: Temp:  [98.3 F (36.8 C)-99.5 F (37.5 C)] 98.3 F (36.8 C) (03/27 0533) Pulse Rate:  [74-85] 78 (03/27 0533) Resp:  [18-19] 18 (03/27 0533) BP: (81-102)/(54-70) 88/58 mmHg (03/27 0533) SpO2:  [97 %-100 %] 97 % (03/27 0533) Last BM Date: 04/22/13  Intake/Output from previous day: 03/26 0701 - 03/27 0700 In: 640 [P.O.:640] Out: 800 [Urine:800] Intake/Output this shift:    Medications Current Facility-Administered Medications  Medication Dose Route Frequency Provider Last Rate Last Dose  . aspirin chewable tablet 81 mg  81 mg Oral Daily Peter M Martinique, MD   81 mg at 04/24/13 0959  . atorvastatin (LIPITOR) tablet 80 mg  80 mg Oral q1800 Alwyn Pea, MD   80 mg at 04/24/13 1731  . heparin injection 5,000 Units  5,000 Units Subcutaneous 3 times per day Peter M Martinique, MD   5,000 Units at 04/25/13 0556  . metoprolol tartrate (LOPRESSOR) tablet 12.5 mg  12.5 mg Oral BID Alwyn Pea, MD   12.5 mg at 04/24/13 2204  . multivitamin with minerals tablet 1 tablet  1 tablet Oral Daily Peter M Martinique, MD   1 tablet at 04/24/13 [phone]  . nitroGLYCERIN (NITROSTAT) SL tablet 0.4 mg  0.4 mg Sublingual Q5 Min x 3 PRN Alwyn Pea, MD      . nitroGLYCERIN 0.2 mg/mL in dextrose 5 % infusion  5 mcg/min Intravenous Once Leota Jacobsen, MD      .  prasugrel (EFFIENT) tablet 10 mg  10 mg Oral Daily Rhonda G Barrett, PA-C   10 mg at 04/24/13 8850    PE: General appearance: alert, cooperative and no distress Neck: no JVD Lungs: clear to auscultation bilaterally Heart: regular rate and rhythm and ? friction rub heard along LSB and apex Extremities: no LEE Pulses: 2+ and symmetric Skin: warm and dry Neurologic: Grossly normal  Lab Results:   Recent Labs  04/22/13 2315 04/23/13 0445 04/23/13 1020  WBC 11.1* 8.6 8.0  HGB 12.5 12.1 12.5  HCT 34.7* 34.0* 35.5*  PLT 202 191 191   BMET  Recent Labs  04/22/13 2014 04/22/13 2315 04/23/13 0445 04/23/13 1020  NA 141  --  139 140  K 3.5*  --  3.8 4.2  CL 103  --  104 103  CO2  --   --  20 23  GLUCOSE 152*  --  108* 102*  BUN 13  --  10 10  CREATININE 0.80 0.54 0.52 0.56  CALCIUM  --   --  8.6 9.1   PT/INR  Recent Labs  04/22/13 2010 04/23/13 0445  LABPROT 12.4 13.2  INR 0.94 1.02   Cholesterol  Recent Labs  04/23/13 0445  CHOL 174   Cardiac Panel (last 3 results)  Recent Labs  04/22/13 2315 04/23/13 0445 04/23/13 1020  TROPONINI >20.00* >20.00* >20.00*  Assessment/Plan  Active Problems:   STEMI (ST elevation myocardial infarction)   ST elevation myocardial infarction (STEMI) involving left anterior descending (LAD) coronary artery with complication    48 y/o female with no prior cardiac history who was admitted 04/22/13 for STEMI, found to have a 100% occulusion of proximal LAD, treated with PCI + DES and severe disease in a very small Ramus branch, too small for intervention. Moderate LV dysfunction on post STEMI echo w/ an EF of 40-45%.   1. STEMI/ CAD- No further angina. Ambulating w/o difficulty. Continue DAPT w/ ASA + Effient. Continue low dose BB and Lipitor.   2. Moderate Systolic Function - EF of 40-45%. Continue on BB. Unable to introduce ACE/ARB currently due to soft blood pressure. Will reevaluate as an OP.   3. Hypotension - SBP  this am was 88. Pt has been asymptomatic, denying dizziness/ lightheadedness. Will keep on lowest dose of Lopressor. Pt instructed to call our office is she develops symptoms of hypotension.   Dispo: Pt can likely go home later today, if cleared by MD. Will arrange f/u appointment with Dr. Martinique or Extender in 1-2 weeks.   LOS: 3 days  Brittainy M. Ladoris Gene 04/25/2013 7:51 AM  I have personally seen and examined this patient with Lyda Jester, PA-C. I agree with the assessment and plan as outlined above. She is s/p anterior MI with DES placed LAD. Doing well. LV function is mildly depressed. Medical therapy as above. Discharge home today and f/u with Dr. Martinique or office PA/NP in 1-2 weeks.   Abbeygail Igoe 04/25/2013 9:19 AM

## 2013-04-25 NOTE — Discharge Summary (Signed)
See full note.cdm 

## 2013-04-25 NOTE — Discharge Summary (Signed)
Physician Discharge Summary  Patient ID: Misty Griffin MRN: [id number] DOB/AGE: 06-Jun-1965 48 y.o.  Admit date: 04/22/2013 Discharge date: 04/25/2013  Admission Diagnoses: STEMI  Discharge Diagnoses:  Active Problems:   STEMI (ST elevation myocardial infarction)   ST elevation myocardial infarction (STEMI) involving left anterior descending (LAD) coronary artery with complication   Coronary atherosclerosis of native coronary artery   Cardiomyopathy, ischemic   Discharged Condition: stable  Hospital Course: The patient is a 48 y/o female with no prior history of CAD or MI and no history of HTN, DM, HLD or tobacco abuse, who presented to the [facility] ER on 04/22/13 with a complaint of chest pain, with associated left arm discomfort, n/v. EKG revealed anterior ST elevations. Initial troponin level was > 20.0. CODE STEMI was activated. She was given ASA, Nitro and Heparin bolus and was transferred to [facility] for urgent LHC. The procedure was performed by Dr. Martinique, via the right radial artery. She was found to have 100 % occlusion of the proximal LAD, successfully treated with PCI utilizing a DES. She was also noted to have severe disease in a very small Ramus branch, however the vessel was too small to interference on. She left the cath lab in stable condition. She had no further chest pain. She was placed on DAPT with ASA + Effient, as well as low dose Lopressor and high dose Lipitor. An ACE/ARB was not initiated secondary to soft blood pressures. She had SBPs ranging from the upper 80s to the low 100s. Despite this, she was asymptomatic, denying dizziness, syncope/ near syncope. She had no post cath complications. The right radial access site remained stable, free from hematoma. A 2 D echo was performed, which demonstrated mild to moderate LV dysfunction, with an estimated EF of 40-45%. There was severe septal, anterior and apical wall hypokinesis, consistent with LAD territory ischemia/infarct. Cardiac  rehab was consulted to assist with phase I rehab. She had no difficulties with ambulation, denying exertional chest pain and dyspnea. On hospital day 3, she was seen and examined by Dr. Angelena Form, who determined she was stable for discharge home. She is scheduled for post-hospital f/u with Truitt Merle, NP. She will need to be followed by Dr. Martinique long term.    Consults: None  Significant Diagnostic Studies:   Emergent LHC 04/22/13 PCI Data:  Vessel - LAD/Segment - proximal  Percent Stenosis (pre) 100%  TIMI-flow 0  Stent 3.0 x 16 mm Promus  Percent Stenosis (post) 0%  TIMI-flow (post) 3  Final Conclusions:  1. 2 vessel obstructive CAD with occluded proximal LAD. She also has severe disease in a very small Ramus branch that is too small for intervention.  2. Severe LV dysfunction.  3. Successful stenting of the proximal LAD with a DES.    2D echo 04/23/13 Study Conclusions  - Left ventricle: The cavity size was normal. Wall thickness was normal. Systolic function was mildly to moderately reduced. The estimated ejection fraction was in the range of 40% to 45%. Severe septal, anterior and apical wall hypokinesis suggesting LAD territory ischemia/infarct. Doppler parameters are consistent with abnormal left ventricular relaxation (grade 1 diastolic dysfunction). The E/e' ratio is <10, suggesting normal LV filling pressure. - Mitral valve: Moderately thickened leaflets, with mild late systolic bileaflet prolapse and mild central regurgitation. - Left atrium: The atrium was normal in size. - Right ventricle: The cavity size was normal. Wall thickness was normal. Systolic function was normal. RV systolic pressure: 93YB Hg (S, est). - Tricuspid valve:  Structurally normal valve. Mild regurgitation. - Inferior vena cava: The IVC measures <2.1 cm, but does not collapse. Suggesting an elevated RA pressure of 8 mmHg. - Pericardium, extracardiac: There was no  pericardial effusion.   Treatments: See Hospital Course  Discharge Exam: Blood pressure 88/58, pulse 78, temperature 98.3 F (36.8 C), temperature source Oral, resp. rate 18, height 5' (1.524 m), weight 132 lb (59.875 kg), last menstrual period 01/22/2013, SpO2 97.00%.   Disposition: Home      Discharge Orders   Future Appointments Provider Department Dept Phone   05/13/2013 9:00 AM Burtis Junes, NP [facility] [phone]   Future Orders Complete By Expires   Amb Referral to Cardiac Rehabilitation  As directed    Diet - low sodium heart healthy  As directed    Increase activity slowly  As directed        Medication List         aspirin 81 MG chewable tablet  Chew 1 tablet (81 mg total) by mouth daily.     atorvastatin 80 MG tablet  Commonly known as:  LIPITOR  Take 1 tablet (80 mg total) by mouth daily at 6 PM.     cetirizine 10 MG tablet  Commonly known as:  ZYRTEC  Take 10 mg by mouth at bedtime as needed for allergies.     diphenhydrAMINE 25 MG tablet  Commonly known as:  SOMINEX  Take 25 mg by mouth at bedtime as needed for allergies.     magnesium hydroxide 400 MG/5ML suspension  Commonly known as:  MILK OF MAGNESIA  Take 15-30 mLs by mouth daily as needed for moderate constipation.     metoprolol tartrate 25 MG tablet  Commonly known as:  LOPRESSOR  Take 0.5 tablets (12.5 mg total) by mouth 2 (two) times daily.     MULTIPLE VITAMIN PO  Take 1 tablet by mouth daily.     nitroGLYCERIN 0.4 MG SL tablet  Commonly known as:  NITROSTAT  Place 1 tablet (0.4 mg total) under the tongue every 5 (five) minutes x 3 doses as needed for chest pain.     polyethylene glycol packet  Commonly known as:  MIRALAX / GLYCOLAX  Take 17 g by mouth daily as needed for moderate constipation.     prasugrel 10 MG Tabs tablet  Commonly known as:  EFFIENT  Take 1 tablet (10 mg total) by mouth daily.     prasugrel 10 MG Tabs tablet  Commonly known as:   EFFIENT  Take 1 tablet (10 mg total) by mouth daily.       Follow-up Information   Follow up with Truitt Merle, NP On 05/13/2013. (9:00 am )    Specialty:  Nurse Practitioner   Contact information:   [place]  [address] [phone]      TIME SPENT ON DISCHARGE, INCLUDING PHYSICIAN TIME: >30 MINUTES  Signed: Lyda Jester 04/25/2013, 10:51 AM

## 2013-04-25 NOTE — Progress Notes (Signed)
IV and tele monitor d/c at this time; pt given d/c instructions; pt to d/c home with family; will cont. To monitor.

## 2013-05-13 ENCOUNTER — Ambulatory Visit (INDEPENDENT_AMBULATORY_CARE_PROVIDER_SITE_OTHER): Payer: PRIVATE HEALTH INSURANCE | Admitting: Nurse Practitioner

## 2013-05-13 ENCOUNTER — Encounter: Payer: Self-pay | Admitting: Nurse Practitioner

## 2013-05-13 VITALS — BP 100/68 | HR 72 | Ht 61.0 in | Wt 133.8 lb

## 2013-05-13 DIAGNOSIS — E785 Hyperlipidemia, unspecified: Secondary | ICD-10-CM

## 2013-05-13 DIAGNOSIS — I2589 Other forms of chronic ischemic heart disease: Secondary | ICD-10-CM

## 2013-05-13 DIAGNOSIS — I2109 ST elevation (STEMI) myocardial infarction involving other coronary artery of anterior wall: Secondary | ICD-10-CM

## 2013-05-13 DIAGNOSIS — I255 Ischemic cardiomyopathy: Secondary | ICD-10-CM

## 2013-05-13 LAB — CBC
HCT: 38.6 % (ref 36.0–46.0)
Hemoglobin: 13 g/dL (ref 12.0–15.0)
MCHC: 33.6 g/dL (ref 30.0–36.0)
MCV: 87.8 fl (ref 78.0–100.0)
Platelets: 244 10*3/uL (ref 150.0–400.0)
RBC: 4.39 Mil/uL (ref 3.87–5.11)
RDW: 13.1 % (ref 11.5–14.6)
WBC: 5.4 10*3/uL (ref 4.5–10.5)

## 2013-05-13 LAB — BASIC METABOLIC PANEL
BUN: 15 mg/dL (ref 6–23)
CO2: 27 mEq/L (ref 19–32)
Calcium: 9.5 mg/dL (ref 8.4–10.5)
Chloride: 107 mEq/L (ref 96–112)
Creatinine, Ser: 0.6 mg/dL (ref 0.4–1.2)
GFR: 111.96 mL/min (ref >60.00)
Glucose, Bld: 83 mg/dL (ref 70–99)
Potassium: 3.7 mEq/L (ref 3.5–5.1)
Sodium: 142 mEq/L (ref 135–145)

## 2013-05-13 NOTE — Patient Instructions (Addendum)
I will send a note to cardiac rehab  We will check some lab today  We will check EKG today  Stay on your current medicines for now  May continue to work from home  - starting next week may begin working 1/2 days (4hrs) per day until seen back  See me & Dr. Martinique in 3 weeks

## 2013-05-13 NOTE — Progress Notes (Signed)
Misty Griffin Date of Birth: 1965-11-08 Medical Record #[id number]  History of Present Illness: Misty Griffin is seen back today for a post hospital visit. Seen for Dr. Martinique. She is a 48 year old female with no past medical history or CAD.  Presented on 04/22/13 with chest pain - initial troponin over 20 - Code STEMI called - was cathed and had 100% occlusion of the LAD treated with DES. Has severe disease in a very small ramus but too small to intervene on - On DAPT with aspirin and Effient. Statin and beta blocker started. BP too soft for ACE. EF was better at 40 to 45% by echo with severe septal, anterior and apical wall hypokinesis consistent with LAD territory ischemia/infarct.  Comes in today. Here with her husband. She is doing ok. No chest pain. Not short of breath. She does note that she is feeling every twinge, twitch, pinch, etc in her chest - nothing like her presenting symptoms. She is walking about 10 minutes a day. BP running around 235 systolic at home. Does note that in the afternoon - BP is lower - down to the 80's and her arms will feel heavy - no pain. She is not dizzy. Not lightheaded. No swelling. Has been working from home. 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 She is tolerating her Effient. No bleeding or bruising noted.   Current Outpatient Prescriptions  Medication Sig Dispense Refill  . aspirin 81 MG chewable tablet Chew 1 tablet (81 mg total) by mouth daily.      Marland Kitchen atorvastatin (LIPITOR) 80 MG tablet Take 1 tablet (80 mg total) by mouth daily at 6 PM.  30 tablet  5  . cetirizine (ZYRTEC) 10 MG tablet Take 10 mg by mouth at bedtime as needed for allergies.      . magnesium hydroxide (MILK OF MAGNESIA) 400 MG/5ML suspension Take 15-30 mLs by mouth daily as needed for moderate constipation.      . metoprolol tartrate (LOPRESSOR) 25 MG tablet Take 0.5 tablets (12.5 mg total) by mouth 2 (two) times daily.  30 tablet  5  . MULTIPLE VITAMIN PO Take 1 tablet by mouth  daily.       . nitroGLYCERIN (NITROSTAT) 0.4 MG SL tablet Place 1 tablet (0.4 mg total) under the tongue every 5 (five) minutes x 3 doses as needed for chest pain.  25 tablet  2  . polyethylene glycol (MIRALAX / GLYCOLAX) packet Take 17 g by mouth daily as needed for moderate constipation.      . prasugrel (EFFIENT) 10 MG TABS tablet Take 1 tablet (10 mg total) by mouth daily.  30 tablet  10   No current facility-administered medications for this visit.    Allergies  Allergen Reactions  . Miconazole Nitrate Other (See Comments)    Abrastion feeling    Past Medical History  Diagnosis Date  . Hemorrhoids   . Vitamin D deficiency   . STEMI (ST elevation myocardial infarction) 04/22/13    anterior; s/p PCI to LAD  . CAD (coronary artery disease) 04/22/13    s/p PCI + DES to proximal LAD; severe disease in a small Ramus Branch, not amendable to PCI  . Systolic dysfunction, left ventricle 3/35/15    EF of 40-45% by TTE  . Diastolic dysfunction 3/61/44    grade I    Past Surgical History  Procedure Laterality Date  . Cesarean section  1993, 2000    History  Smoking status  .  Never Smoker   Smokeless tobacco  . Never Used    History  Alcohol Use No    Family History  Problem Relation Age of Onset  . Colon polyps Mother   . Heart attack Maternal Aunt   . Heart attack Maternal Grandmother   . Heart attack Maternal Grandfather   . Heart attack Paternal Grandfather     Review of Systems: The review of systems is per the HPI.  All other systems were reviewed and are negative.  Physical Exam: BP 100/68  Pulse 72  Ht 5\' 1"  (1.549 m)  Wt 133 lb 12.8 oz (60.691 kg)  BMI 25.29 kg/m2  LMP 01/22/2013 Patient is very pleasant and in no acute distress. Skin is warm and dry. Color is normal.  HEENT is unremarkable. Normocephalic/atraumatic. PERRL. Sclera are nonicteric. Neck is supple. No masses. No JVD. Lungs are clear. Cardiac exam shows a regular rate and rhythm. Abdomen is  soft. Extremities are without edema. Gait and ROM are intact. No gross neurologic deficits noted.  LABORATORY DATA: PENDING  Lab Results  Component Value Date   WBC 8.0 04/23/2013   HGB 12.5 04/23/2013   HCT 35.5* 04/23/2013   PLT 191 04/23/2013   GLUCOSE 102* 04/23/2013   CHOL 174 04/23/2013   TRIG 53 04/23/2013   HDL 68 04/23/2013   LDLCALC 95 04/23/2013   NA 140 04/23/2013   K 4.2 04/23/2013   CL 103 04/23/2013   CREATININE 0.56 04/23/2013   BUN 10 04/23/2013   CO2 23 04/23/2013   INR 1.02 04/23/2013   HGBA1C 5.5 04/22/2013    Lab Results  Component Value Date   TROPONINI >20.00* 04/23/2013    Coronary angiography:  Coronary dominance: right  Left mainstem: Normal.  Left anterior descending (LAD): The LAD is occluded proximally with TIMI 0 flow and no collaterals.  There is a very small Ramus intermediate branch with a 99% stenosis proximally.  Left circumflex (LCx): Normal.  Right coronary artery (RCA): mild focal disease in the mid vessel to 20%.  Left ventriculography: Left ventricular systolic function is abnormal, LVEF is estimated at 30-35%, there is severe hypokinesis of the mid anterior wall with akinesis of the distal anterior wall and dyskinesis of the apex. There is no significant mitral regurgitation  PCI Note: Following the diagnostic procedure, the decision was made to proceed with PCI of the LAD. Brilinta 180 mg was given orally. Weight-based bivalirudin was given for anticoagulation. Once a therapeutic ACT was achieved, a 6 [place] XBLAD 3.5 guide catheter was inserted. A prowater coronary guidewire was used to cross the lesion. The lesion was predilated with a 2.5 mm balloon. The lesion was then stented with a 3.0 x 16 mm Promus stent. Following PCI, there was 0% residual stenosis and TIMI-3 flow. The mid to distal LAD was very small and tapering. The first diagonal is moderate in size and normal. Final angiography confirmed an excellent result. The patient tolerated the  procedure well. There were no immediate procedural complications. A TR band was used for radial hemostasis. The patient was transferred to the post catheterization recovery area for further monitoring.  PCI Data:  Vessel - LAD/Segment - proximal  Percent Stenosis (pre) 100%  TIMI-flow 0  Stent 3.0 x 16 mm Promus  Percent Stenosis (post) 0%  TIMI-flow (post) 3  Final Conclusions:  1. 2 vessel obstructive CAD with occluded proximal LAD. She also has severe disease in a very small Ramus branch that is too small for intervention.  2. Severe LV dysfunction.  3. Successful stenting of the proximal LAD with a DES.  Recommendations:  Dual antiplatelet therapy for at least one year. Aggressive risk factor modification. Will continue low dose bivalirudin until bottle complete.  Collier Salina [facility]  04/22/2013, 9:49 PM  Echo Study Conclusions  - Left ventricle: The cavity size was normal. Wall thickness was normal. Systolic function was mildly to moderately reduced. The estimated ejection fraction was in the range of 40% to 45%. Severe septal, anterior and apical wall hypokinesis suggesting LAD territory ischemia/infarct. Doppler parameters are consistent with abnormal left ventricular relaxation (grade 1 diastolic dysfunction). The E/e' ratio is <10, suggesting normal LV filling pressure. - Mitral valve: Moderately thickened leaflets, with mild late systolic bileaflet prolapse and mild central regurgitation. - Left atrium: The atrium was normal in size. - Right ventricle: The cavity size was normal. Wall thickness was normal. Systolic function was normal. RV systolic pressure: 62VO Hg (S, est). - Tricuspid valve: Structurally normal valve. Mild regurgitation. - Inferior vena cava: The IVC measures <2.1 cm, but does not collapse. Suggesting an elevated RA pressure of 8 mmHg. - Pericardium, extracardiac: There was no pericardial effusion.   Assessment / Plan: 1. Anterior STEMI - s/p  DES to the LAD - residual disease in the ramus but too small to intervene on - she is doing well clinically. Will leave her on her current regimen. EKG today with sinus - evolving changes noted - reviewed with Dr. Martinique. Will send note to cardiac rehab. She had a good exercise habit prior to this event.   2. HLD - on high dose statin - needs fasting labs on return visit  3. Ischemic CM - EF was improved on echo from her cath - will need repeat echo after June 24th - BP too soft for ACE.   See back in 3 weeks. Ok to continue to work from home. May work 4 hours/day as of next week. Check baseline labs today. Fasting labs on return.  Patient is agreeable to this plan and will call if any problems develop in the interim.   Burtis Junes, RN, [facility] [address] [place], [place]  [id number] [phone]

## 2013-05-30 ENCOUNTER — Other Ambulatory Visit: Payer: Self-pay | Admitting: *Deleted

## 2013-05-30 ENCOUNTER — Ambulatory Visit (INDEPENDENT_AMBULATORY_CARE_PROVIDER_SITE_OTHER): Payer: PRIVATE HEALTH INSURANCE | Admitting: Nurse Practitioner

## 2013-05-30 ENCOUNTER — Encounter (INDEPENDENT_AMBULATORY_CARE_PROVIDER_SITE_OTHER): Payer: Self-pay

## 2013-05-30 ENCOUNTER — Encounter: Payer: Self-pay | Admitting: Nurse Practitioner

## 2013-05-30 VITALS — BP 100/70 | HR 56 | Ht 61.0 in | Wt 132.0 lb

## 2013-05-30 DIAGNOSIS — I255 Ischemic cardiomyopathy: Secondary | ICD-10-CM

## 2013-05-30 DIAGNOSIS — I2102 ST elevation (STEMI) myocardial infarction involving left anterior descending coronary artery: Secondary | ICD-10-CM

## 2013-05-30 DIAGNOSIS — I2109 ST elevation (STEMI) myocardial infarction involving other coronary artery of anterior wall: Secondary | ICD-10-CM

## 2013-05-30 DIAGNOSIS — E785 Hyperlipidemia, unspecified: Secondary | ICD-10-CM

## 2013-05-30 DIAGNOSIS — I2589 Other forms of chronic ischemic heart disease: Secondary | ICD-10-CM

## 2013-05-30 LAB — HEPATIC FUNCTION PANEL
ALT: 28 U/L (ref 0–35)
AST: 29 U/L (ref 0–37)
Albumin: 4 g/dL (ref 3.5–5.2)
Alkaline Phosphatase: 46 U/L (ref 39–117)
Bilirubin, Direct: 0 mg/dL (ref 0.0–0.3)
Total Bilirubin: 0.8 mg/dL (ref 0.3–1.2)
Total Protein: 6.6 g/dL (ref 6.0–8.3)

## 2013-05-30 LAB — LIPID PANEL
Cholesterol: 100 mg/dL (ref 0–200)
HDL: 52.4 mg/dL (ref >39.00)
LDL Cholesterol: 37 mg/dL (ref 0–99)
Total CHOL/HDL Ratio: 2
Triglycerides: 53 mg/dL (ref 0.0–149.0)
VLDL: 10.6 mg/dL (ref 0.0–40.0)

## 2013-05-30 LAB — BASIC METABOLIC PANEL
BUN: 11 mg/dL (ref 6–23)
CO2: 25 mEq/L (ref 19–32)
Calcium: 9.4 mg/dL (ref 8.4–10.5)
Chloride: 104 mEq/L (ref 96–112)
Creatinine, Ser: 0.6 mg/dL (ref 0.4–1.2)
GFR: 107.85 mL/min (ref >60.00)
Glucose, Bld: 85 mg/dL (ref 70–99)
Potassium: 3.7 mEq/L (ref 3.5–5.1)
Sodium: 138 mEq/L (ref 135–145)

## 2013-05-30 MED ORDER — ATORVASTATIN CALCIUM 80 MG PO TABS: 40.0000 mg | ORAL_TABLET | Freq: Every day | ORAL | Status: DC

## 2013-05-30 NOTE — Progress Notes (Signed)
Misty Griffin Date of Birth: 1965/04/02 Medical Record #[id number]  History of Present Illness: Misty Griffin is seen back today for a 3 week visit. Seen for Dr. Martinique. She is a 48 year old female with no past medical history and no prior CAD.   Presented on 04/22/13 with chest pain - initial troponin over 20 - Code STEMI called - was cathed and had 100% occlusion of the LAD treated with DES. Has severe disease in a very small ramus but too small to intervene on - On DAPT with aspirin and Effient. Statin and beta blocker started. BP too soft for ACE. EF was better at 40 to 45% by echo with severe septal, anterior and apical wall hypokinesis consistent with LAD territory ischemia/infarct.   I saw her 3 weeks ago - was doing ok - some hypotension - not able to add ACE.   Comes in today. Here with her husband. She is doing great. No chest pain. Not short of breath. No swelling. No cough. Feels good. Energy level back to normal. Working four hours per day. Walking an hour each day. BP barely 734 systolic at home - still drops in the high 80's but she feels fine.   Current Outpatient Prescriptions  Medication Sig Dispense Refill  . aspirin 81 MG chewable tablet Chew 1 tablet (81 mg total) by mouth daily.      Marland Kitchen atorvastatin (LIPITOR) 80 MG tablet Take 1 tablet (80 mg total) by mouth daily at 6 PM.  30 tablet  5  . cetirizine (ZYRTEC) 10 MG tablet Take 10 mg by mouth at bedtime as needed for allergies.      . magnesium hydroxide (MILK OF MAGNESIA) 400 MG/5ML suspension Take 15-30 mLs by mouth daily as needed for moderate constipation.      . metoprolol tartrate (LOPRESSOR) 25 MG tablet Take 0.5 tablets (12.5 mg total) by mouth 2 (two) times daily.  30 tablet  5  . MULTIPLE VITAMIN PO Take 1 tablet by mouth daily.       . nitroGLYCERIN (NITROSTAT) 0.4 MG SL tablet Place 1 tablet (0.4 mg total) under the tongue every 5 (five) minutes x 3 doses as needed for chest pain.  25 tablet  2  . polyethylene  glycol (MIRALAX / GLYCOLAX) packet Take 17 g by mouth daily as needed for moderate constipation.      . prasugrel (EFFIENT) 10 MG TABS tablet Take 1 tablet (10 mg total) by mouth daily.  30 tablet  10   No current facility-administered medications for this visit.    Allergies  Allergen Reactions  . Miconazole Nitrate Other (See Comments)    Abrastion feeling    Past Medical History  Diagnosis Date  . Hemorrhoids   . Vitamin D deficiency   . STEMI (ST elevation myocardial infarction) 04/22/13    anterior; s/p PCI to LAD  . CAD (coronary artery disease) 04/22/13    s/p PCI + DES to proximal LAD; severe disease in a small Ramus Branch, not amendable to PCI  . Systolic dysfunction, left ventricle 3/35/15    EF of 40-45% by TTE  . Diastolic dysfunction 1/93/79    grade I    Past Surgical History  Procedure Laterality Date  . Cesarean section  1993, 2000    History  Smoking status  . Never Smoker   Smokeless tobacco  . Never Used    History  Alcohol Use No    Family History  Problem Relation Age  of Onset  . Colon polyps Mother   . Heart attack Maternal Aunt   . Heart attack Maternal Grandmother   . Heart attack Maternal Grandfather   . Heart attack Paternal Grandfather     Review of Systems: The review of systems is per the HPI.  All other systems were reviewed and are negative.  Physical Exam: BP 100/70  Pulse 56  Ht 5\' 1"  (1.549 m)  Wt 132 lb (59.875 kg)  BMI 24.95 kg/m2  SpO2 100% Patient is very pleasant and in no acute distress. She looks great. Skin is warm and dry. Color is normal.  HEENT is unremarkable. Normocephalic/atraumatic. PERRL. Sclera are nonicteric. Neck is supple. No masses. No JVD. Lungs are clear. Cardiac exam shows a regular rate and rhythm. Abdomen is soft. Extremities are without edema. Gait and ROM are intact. No gross neurologic deficits noted.  LABORATORY DATA: fasting labs pending  Lab Results  Component Value Date   WBC 5.4  05/13/2013   HGB 13.0 05/13/2013   HCT 38.6 05/13/2013   PLT 244.0 05/13/2013   GLUCOSE 83 05/13/2013   CHOL 174 04/23/2013   TRIG 53 04/23/2013   HDL 68 04/23/2013   LDLCALC 95 04/23/2013   NA 142 05/13/2013   K 3.7 05/13/2013   CL 107 05/13/2013   CREATININE 0.6 05/13/2013   BUN 15 05/13/2013   CO2 27 05/13/2013   INR 1.02 04/23/2013   HGBA1C 5.5 04/22/2013    Coronary angiography:  Coronary dominance: right  Left mainstem: Normal.  Left anterior descending (LAD): The LAD is occluded proximally with TIMI 0 flow and no collaterals.  There is a very small Ramus intermediate branch with a 99% stenosis proximally.  Left circumflex (LCx): Normal.  Right coronary artery (RCA): mild focal disease in the mid vessel to 20%.  Left ventriculography: Left ventricular systolic function is abnormal, LVEF is estimated at 30-35%, there is severe hypokinesis of the mid anterior wall with akinesis of the distal anterior wall and dyskinesis of the apex. There is no significant mitral regurgitation  PCI Note: Following the diagnostic procedure, the decision was made to proceed with PCI of the LAD. Brilinta 180 mg was given orally. Weight-based bivalirudin was given for anticoagulation. Once a therapeutic ACT was achieved, a 6 [place] XBLAD 3.5 guide catheter was inserted. A prowater coronary guidewire was used to cross the lesion. The lesion was predilated with a 2.5 mm balloon. The lesion was then stented with a 3.0 x 16 mm Promus stent. Following PCI, there was 0% residual stenosis and TIMI-3 flow. The mid to distal LAD was very small and tapering. The first diagonal is moderate in size and normal. Final angiography confirmed an excellent result. The patient tolerated the procedure well. There were no immediate procedural complications. A TR band was used for radial hemostasis. The patient was transferred to the post catheterization recovery area for further monitoring.  PCI Data:  Vessel - LAD/Segment - proximal    Percent Stenosis (pre) 100%  TIMI-flow 0  Stent 3.0 x 16 mm Promus  Percent Stenosis (post) 0%  TIMI-flow (post) 3  Final Conclusions:  1. 2 vessel obstructive CAD with occluded proximal LAD. She also has severe disease in a very small Ramus branch that is too small for intervention.  2. Severe LV dysfunction.  3. Successful stenting of the proximal LAD with a DES.  Recommendations:  Dual antiplatelet therapy for at least one year. Aggressive risk factor modification. Will continue low dose bivalirudin until  bottle complete.  Collier Salina [facility]  04/22/2013, 9:49 PM    Echo Study Conclusions  - Left ventricle: The cavity size was normal. Wall thickness was normal. Systolic function was mildly to moderately reduced. The estimated ejection fraction was in the range of 40% to 45%. Severe septal, anterior and apical wall hypokinesis suggesting LAD territory ischemia/infarct. Doppler parameters are consistent with abnormal left ventricular relaxation (grade 1 diastolic dysfunction). The E/e' ratio is <10, suggesting normal LV filling pressure. - Mitral valve: Moderately thickened leaflets, with mild late systolic bileaflet prolapse and mild central regurgitation. - Left atrium: The atrium was normal in size. - Right ventricle: The cavity size was normal. Wall thickness was normal. Systolic function was normal. RV systolic pressure: 28BT Hg (S, est). - Tricuspid valve: Structurally normal valve. Mild regurgitation. - Inferior vena cava: The IVC measures <2.1 cm, but does not collapse. Suggesting an elevated RA pressure of 8 mmHg. - Pericardium, extracardiac: There was no pericardial effusion.  Assessment / Plan:  1. Anterior STEMI - s/p DES to the LAD - residual disease in the ramus but too small to intervene on - she is doing very well clinically. Will leave her on her current regimen. Order signed by Dr. Martinique for cardiac rehab.   2. HLD - on high dose statin - checking  fasting labs today.  3. Ischemic CM - EF was improved on echo from her cath - will need repeat echo after June 24th - BP still too soft for ACE. Her echo is ordered for the end of June and she will see Dr. Martinique in early July.  Ok to return to work full time. Ok to resume sexual relations. Sending to cardiac rehab. Overall, she is felt to be doing very well.  Patient is agreeable to this plan and will call if any problems develop in the interim.   Burtis Junes, RN, [place]  [address] [place]  [place], [place] [address]  [phone]

## 2013-05-30 NOTE — Patient Instructions (Signed)
We will check fasting labs today  We will arrange for an echocardiogram after June 24th - this will tell us how much recovery you have had of your pumping function   See Dr. Martinique a couple of days afterwards to discuss - this will be at his new location at [facility]  Stay on your current medicines  May return to work full time  Have sent an order for cardiac rehab.   Keep up the good work!  Call the [place] office at [phone] if you have any questions, problems or concerns.

## 2013-06-05 ENCOUNTER — Encounter (HOSPITAL_COMMUNITY)
Admission: RE | Admit: 2013-06-05 | Discharge: 2013-06-05 | Disposition: A | Discharge status: Home / Self Care | Payer: PRIVATE HEALTH INSURANCE | Source: Ambulatory Visit | Attending: Cardiology | Admitting: Cardiology

## 2013-06-05 DIAGNOSIS — I219 Acute myocardial infarction, unspecified: Secondary | ICD-10-CM | POA: Insufficient documentation

## 2013-06-05 DIAGNOSIS — I2510 Atherosclerotic heart disease of native coronary artery without angina pectoris: Secondary | ICD-10-CM | POA: Insufficient documentation

## 2013-06-05 DIAGNOSIS — I5022 Chronic systolic (congestive) heart failure: Secondary | ICD-10-CM | POA: Insufficient documentation

## 2013-06-05 DIAGNOSIS — I2589 Other forms of chronic ischemic heart disease: Secondary | ICD-10-CM | POA: Insufficient documentation

## 2013-06-05 DIAGNOSIS — Z5189 Encounter for other specified aftercare: Secondary | ICD-10-CM | POA: Insufficient documentation

## 2013-06-05 DIAGNOSIS — I2109 ST elevation (STEMI) myocardial infarction involving other coronary artery of anterior wall: Secondary | ICD-10-CM | POA: Insufficient documentation

## 2013-06-05 NOTE — Progress Notes (Signed)
Cardiac Rehab Medication Review by a Pharmacist  Does the patient  feel that his/her medications are working for him/her?  yes  Has the patient been experiencing any side effects to the medications prescribed?  no  Does the patient measure his/her own blood pressure or blood glucose at home?  Yes, BP ~ 90/68 per patient. No symptoms of hypotension reported   Does the patient have any problems obtaining medications due to transportation or finances?   no  Understanding of regimen: good Understanding of indications: good Potential of compliance: good    Pharmacist comments:   Misty Griffin is a 74 yoWF who presents in good spirits this morning. She does not complain of any issues this morning. Reported home BP readings have been low but asymptomatic and says she has always been low. MD was not able to ACE due to low BP. I have updated all of her allergies and medications.  Misty Griffin. Misty Griffin, PharmD Clinical Pharmacist - Resident Phone: [phone] Pager: [phone] 06/05/2013 8:35 AM

## 2013-06-09 ENCOUNTER — Encounter (HOSPITAL_COMMUNITY)
Admission: RE | Admit: 2013-06-09 | Discharge: 2013-06-09 | Disposition: A | Discharge status: Home / Self Care | Payer: PRIVATE HEALTH INSURANCE | Source: Ambulatory Visit | Attending: Cardiology | Admitting: Cardiology

## 2013-06-09 DIAGNOSIS — I219 Acute myocardial infarction, unspecified: Secondary | ICD-10-CM | POA: Diagnosis present

## 2013-06-09 DIAGNOSIS — I2589 Other forms of chronic ischemic heart disease: Secondary | ICD-10-CM | POA: Diagnosis not present

## 2013-06-09 DIAGNOSIS — I5022 Chronic systolic (congestive) heart failure: Secondary | ICD-10-CM | POA: Diagnosis not present

## 2013-06-09 DIAGNOSIS — Z5189 Encounter for other specified aftercare: Secondary | ICD-10-CM | POA: Diagnosis not present

## 2013-06-09 DIAGNOSIS — I2510 Atherosclerotic heart disease of native coronary artery without angina pectoris: Secondary | ICD-10-CM | POA: Diagnosis not present

## 2013-06-09 DIAGNOSIS — I2109 ST elevation (STEMI) myocardial infarction involving other coronary artery of anterior wall: Secondary | ICD-10-CM | POA: Diagnosis not present

## 2013-06-09 NOTE — Progress Notes (Signed)
Misty Griffin is switching to the 645 class due to her work schedule.

## 2013-06-09 NOTE — Progress Notes (Addendum)
Misty Griffin is here for her first day of exercise at cardiac rehab. Initial blood pressure 94/60. Patient asymptomatic.  Machel said her blood pressures ran low before her hospitalization.  Patient given H20. Recheck blood pressure 98/60 on the nustep.  Patient given gatorade.  Recheck blood 104/62 sitting Standing blood pressure 108/62.  Telemetry rhythm Sinus without ectopy. Will continue to monitor the patient throughout  the program. Exit blood pressure 117/63. Truitt Merle ANP notified about low blood pressures no new orders received.

## 2013-06-11 ENCOUNTER — Encounter (HOSPITAL_COMMUNITY)
Admission: RE | Admit: 2013-06-11 | Discharge: 2013-06-11 | Disposition: A | Discharge status: Home / Self Care | Payer: PRIVATE HEALTH INSURANCE | Source: Ambulatory Visit | Attending: Cardiology | Admitting: Cardiology

## 2013-06-11 ENCOUNTER — Encounter (HOSPITAL_COMMUNITY): Payer: PRIVATE HEALTH INSURANCE

## 2013-06-11 DIAGNOSIS — Z5189 Encounter for other specified aftercare: Secondary | ICD-10-CM | POA: Diagnosis not present

## 2013-06-11 NOTE — Progress Notes (Signed)
Pt changed exercise classes to the 6:45 due to work.  Pt tolerated light exercise with no complaints.  Improved bp today with exercise.  PHQ2 score 0. Pt feels confident and positive about her future.  Continue to monitor.  Cherre Huger, BSN

## 2013-06-13 ENCOUNTER — Encounter (HOSPITAL_COMMUNITY): Payer: PRIVATE HEALTH INSURANCE

## 2013-06-16 ENCOUNTER — Encounter (HOSPITAL_COMMUNITY): Payer: PRIVATE HEALTH INSURANCE

## 2013-06-16 ENCOUNTER — Encounter (HOSPITAL_COMMUNITY)
Admission: RE | Admit: 2013-06-16 | Discharge: 2013-06-16 | Disposition: A | Discharge status: Home / Self Care | Payer: PRIVATE HEALTH INSURANCE | Source: Ambulatory Visit | Attending: Cardiology | Admitting: Cardiology

## 2013-06-16 DIAGNOSIS — Z5189 Encounter for other specified aftercare: Secondary | ICD-10-CM | POA: Diagnosis not present

## 2013-06-16 NOTE — Progress Notes (Signed)
Misty Griffin 48 y.o. female Nutrition Note Spoke with pt.  Pt has not returned her Nutrition Survey to review at this time. Pt graduating Friday 06/20/13. Pt reports she feels like she is following a healthy diet because "we've met with several dietitian's over the years due to my husband's kidney disease." Pt reports not cooking with salt for "years." Pt aware of nutrition education classes offered.  Nutrition Diagnosis   Food-and nutrition-related knowledge deficit related to lack of exposure to information as related to diagnosis of: ? CVD  Nutrition Intervention   Pt to attend the Portion Distortion class   Pt given handouts for: ? Nutrition I class ? Nutrition II class   Continue client-centered nutrition education by RD, as part of interdisciplinary care.  Goal(s)   Pt to identify food quantities necessary to achieve: ? wt loss to a goal wt of 128-130 lb (58.2-59.1 kg) at graduation from cardiac rehab.   Monitor and Evaluate progress toward nutrition goal with team. Nutrition Risk:  Moderate  Derek Mound, M.Ed, RD, LDN, CDE 06/16/2013 7:42 AM

## 2013-06-18 ENCOUNTER — Encounter (HOSPITAL_COMMUNITY): Payer: PRIVATE HEALTH INSURANCE

## 2013-06-18 ENCOUNTER — Encounter (HOSPITAL_COMMUNITY)
Admission: RE | Admit: 2013-06-18 | Discharge: 2013-06-18 | Disposition: A | Discharge status: Home / Self Care | Payer: PRIVATE HEALTH INSURANCE | Source: Ambulatory Visit | Attending: Cardiology | Admitting: Cardiology

## 2013-06-18 DIAGNOSIS — Z5189 Encounter for other specified aftercare: Secondary | ICD-10-CM | POA: Diagnosis not present

## 2013-06-18 NOTE — Progress Notes (Signed)
I have reviewed home exercise with Misty Griffin.   Her last day of cardiac rehab will be Friday, 06/20/13.  The patient was advised to walk or ride a stationary bike 5-7 days per week for 30-45 minutes continuously.  Pt will also complete hand weights 3 days per week.  Progression of exercise prescription was discussed.  Reviewed THR, pulse, RPE, sign and symptoms, NTG use and when to call 911 or MD.  Pt voiced understanding. [id number]  [name], MS, ACSM RCEP 06/18/2013 8:11 AM

## 2013-06-20 ENCOUNTER — Telehealth (HOSPITAL_COMMUNITY): Payer: Self-pay | Admitting: *Deleted

## 2013-06-20 ENCOUNTER — Encounter (HOSPITAL_COMMUNITY): Payer: PRIVATE HEALTH INSURANCE

## 2013-06-20 NOTE — Telephone Encounter (Signed)
Received earlier call that pt would be absent today.  Today would have been pt's last day.  Message left is she wanted to come back on next Wednesday to finish or be done with 5 exercise sessions completed.  Pt also instructed she will need to bring parking badge back to rehab.  Contact information for rehab given.

## 2013-07-03 ENCOUNTER — Encounter: Payer: Self-pay | Admitting: Gastroenterology

## 2013-07-07 ENCOUNTER — Encounter (HOSPITAL_COMMUNITY): Payer: Self-pay | Admitting: *Deleted

## 2013-07-25 ENCOUNTER — Ambulatory Visit (HOSPITAL_COMMUNITY): Payer: PRIVATE HEALTH INSURANCE | Attending: Internal Medicine | Admitting: Radiology

## 2013-07-25 ENCOUNTER — Other Ambulatory Visit (HOSPITAL_COMMUNITY): Payer: PRIVATE HEALTH INSURANCE

## 2013-07-25 DIAGNOSIS — I359 Nonrheumatic aortic valve disorder, unspecified: Secondary | ICD-10-CM | POA: Insufficient documentation

## 2013-07-25 DIAGNOSIS — I2589 Other forms of chronic ischemic heart disease: Secondary | ICD-10-CM | POA: Insufficient documentation

## 2013-07-25 DIAGNOSIS — I2102 ST elevation (STEMI) myocardial infarction involving left anterior descending coronary artery: Secondary | ICD-10-CM

## 2013-07-25 DIAGNOSIS — I2510 Atherosclerotic heart disease of native coronary artery without angina pectoris: Secondary | ICD-10-CM

## 2013-07-25 DIAGNOSIS — E785 Hyperlipidemia, unspecified: Secondary | ICD-10-CM

## 2013-07-25 DIAGNOSIS — I2109 ST elevation (STEMI) myocardial infarction involving other coronary artery of anterior wall: Secondary | ICD-10-CM | POA: Insufficient documentation

## 2013-07-25 DIAGNOSIS — I079 Rheumatic tricuspid valve disease, unspecified: Secondary | ICD-10-CM | POA: Insufficient documentation

## 2013-07-25 DIAGNOSIS — I059 Rheumatic mitral valve disease, unspecified: Secondary | ICD-10-CM | POA: Insufficient documentation

## 2013-07-25 DIAGNOSIS — I255 Ischemic cardiomyopathy: Secondary | ICD-10-CM

## 2013-07-25 NOTE — Progress Notes (Signed)
Echocardiogram performed.  

## 2013-07-28 ENCOUNTER — Telehealth: Payer: Self-pay | Admitting: Cardiology

## 2013-07-28 NOTE — Telephone Encounter (Signed)
Returned call to patient she wanted to know what her EF was.Stated she was surprised it had dropped lower.Stated she had been feeling good and had increased her exercise.Patient stated she had called office earlier wanting to see Dr.Jordan before 08/04/13.Stated she cancelled appointment with Dr.Jordan 08/04/13 and made appointment with Truitt Merle NP 07/30/13. B/P has gone back up.Stated B/P was ranging in 90's and now B/P ranging 101/60 to 110/75.Spoke to Truitt Merle NP she advised for patient to see Dr.Jordan 08/04/13 and to schedule patient appointment with EP.Advised not to increase exercise.Advised to monitor B/P and bring B/P readings to appointment.Appointment scheduled with Dr.Klein 08/15/13 at 11:30 am.Advised she will be placed on a call list and if EP has any cancellations she will be called for a sooner appointment. Advised to call back if needed.

## 2013-07-28 NOTE — Telephone Encounter (Signed)
New message          Pt has questions about echocardiogram results

## 2013-07-30 ENCOUNTER — Ambulatory Visit: Payer: PRIVATE HEALTH INSURANCE | Admitting: Nurse Practitioner

## 2013-08-04 ENCOUNTER — Encounter: Payer: Self-pay | Admitting: Cardiology

## 2013-08-04 ENCOUNTER — Ambulatory Visit: Payer: PRIVATE HEALTH INSURANCE | Admitting: Cardiology

## 2013-08-04 ENCOUNTER — Ambulatory Visit (INDEPENDENT_AMBULATORY_CARE_PROVIDER_SITE_OTHER): Payer: PRIVATE HEALTH INSURANCE | Admitting: Cardiology

## 2013-08-04 VITALS — BP 130/79 | HR 66 | Ht 61.0 in | Wt 129.6 lb

## 2013-08-04 DIAGNOSIS — I2510 Atherosclerotic heart disease of native coronary artery without angina pectoris: Secondary | ICD-10-CM

## 2013-08-04 DIAGNOSIS — I2109 ST elevation (STEMI) myocardial infarction involving other coronary artery of anterior wall: Secondary | ICD-10-CM

## 2013-08-04 DIAGNOSIS — I2102 ST elevation (STEMI) myocardial infarction involving left anterior descending coronary artery: Secondary | ICD-10-CM

## 2013-08-04 DIAGNOSIS — I509 Heart failure, unspecified: Secondary | ICD-10-CM

## 2013-08-04 DIAGNOSIS — I5022 Chronic systolic (congestive) heart failure: Secondary | ICD-10-CM

## 2013-08-04 MED ORDER — LISINOPRIL 2.5 MG PO TABS: 2.5000 mg | ORAL_TABLET | Freq: Every day | ORAL | Status: DC

## 2013-08-04 NOTE — Patient Instructions (Signed)
Continue your current therapy  We will add lisinopril 2.5 mg daily  Keep your appointment with Dr. Caryl Comes

## 2013-08-05 NOTE — Progress Notes (Signed)
Misty Griffin Date of Birth: 07/25/1965 Medical Record #[id number]  History of Present Illness: [name] is seen back today for a follow up visit. Presented on 04/22/13 with chest pain - initial troponin over 20 - Code STEMI called - was cathed and had 100% occlusion of the LAD treated with DES. Presentation relatively late. Has severe disease in a very small ramus but too small to intervene on - On DAPT with aspirin and Effient. Statin and beta blocker started. BP too soft initially for ACE. EF was better at 40 to 45% by echo during hospitatlization with severe septal, anterior and apical wall hypokinesis consistent with LAD territory ischemia/infarct.  She is seen today  with her husband. She feels great. No chest pain. Noted some "dings and pings" after playing Wii sports. Not short of breath. No swelling. No cough.  Energy level back to normal. Working four hours per day. Walking an hour each day. BP doing better. Did drop down to 90s one day at Rehab but she felt great.   Current Outpatient Prescriptions  Medication Sig Dispense Refill  . aspirin 81 MG chewable tablet Chew 1 tablet (81 mg total) by mouth daily.      Marland Kitchen atorvastatin (LIPITOR) 80 MG tablet Take 0.5 tablets (40 mg total) by mouth daily at 6 PM.  30 tablet  5  . cetirizine (ZYRTEC) 10 MG tablet Take 10 mg by mouth at bedtime as needed for allergies.      Marland Kitchen lisinopril (PRINIVIL,ZESTRIL) 2.5 MG tablet Take 1 tablet (2.5 mg total) by mouth daily.  90 tablet  3  . magnesium hydroxide (MILK OF MAGNESIA) 400 MG/5ML suspension Take 15-30 mLs by mouth daily as needed for moderate constipation.      . metoprolol tartrate (LOPRESSOR) 25 MG tablet Take 0.5 tablets (12.5 mg total) by mouth 2 (two) times daily.  30 tablet  5  . MULTIPLE VITAMIN PO Take 1 tablet by mouth daily.       . nitroGLYCERIN (NITROSTAT) 0.4 MG SL tablet Place 1 tablet (0.4 mg total) under the tongue every 5 (five) minutes x 3 doses as needed for chest pain.  25  tablet  2  . polyethylene glycol (MIRALAX / GLYCOLAX) packet Take 17 g by mouth daily as needed for moderate constipation.      . prasugrel (EFFIENT) 10 MG TABS tablet Take 1 tablet (10 mg total) by mouth daily.  30 tablet  10   No current facility-administered medications for this visit.    Allergies  Allergen Reactions  . Miconazole Nitrate Other (See Comments)    Abrasion feeling, "sandpaper" feeling    Past Medical History  Diagnosis Date  . Hemorrhoids   . Vitamin D deficiency   . STEMI (ST elevation myocardial infarction) 04/22/13    anterior; s/p PCI to LAD  . CAD (coronary artery disease) 04/22/13    s/p PCI + DES to proximal LAD; severe disease in a small Ramus Branch, not amendable to PCI  . Systolic dysfunction, left ventricle 3/35/15    EF of 40-45% by TTE  . Diastolic dysfunction 8/75/64    grade I    Past Surgical History  Procedure Laterality Date  . Cesarean section  1993, 2000    History  Smoking status  . Never Smoker   Smokeless tobacco  . Never Used    History  Alcohol Use No    Family History  Problem Relation Age of Onset  . Colon polyps Mother   .  Heart attack Maternal Aunt   . Heart attack Maternal Grandmother   . Heart attack Maternal Grandfather   . Heart attack Paternal Grandfather     Review of Systems: The review of systems is per the HPI.  All other systems were reviewed and are negative.  Physical Exam: BP 130/79  Pulse 66  Ht 5\' 1"  (1.549 m)  Wt 129 lb 9.6 oz (58.786 kg)  BMI 24.50 kg/m2 Patient is very pleasant and in no acute distress.  Skin is warm and dry. Color is normal.  HEENT is unremarkable. Normocephalic/atraumatic. PERRL. Sclera are nonicteric. Neck is supple. No masses. No JVD. Lungs are clear. Cardiac exam shows a regular rate and rhythm. Abdomen is soft. Extremities are without edema. Gait and ROM are intact. No gross neurologic deficits noted.  LABORATORY DATA:   Lab Results  Component Value Date   WBC 5.4  05/13/2013   HGB 13.0 05/13/2013   HCT 38.6 05/13/2013   PLT 244.0 05/13/2013   GLUCOSE 85 05/30/2013   CHOL 100 05/30/2013   TRIG 53.0 05/30/2013   HDL 52.40 05/30/2013   LDLCALC 37 05/30/2013   ALT 28 05/30/2013   AST 29 05/30/2013   NA 138 05/30/2013   K 3.7 05/30/2013   CL 104 05/30/2013   CREATININE 0.6 05/30/2013   BUN 11 05/30/2013   CO2 25 05/30/2013   INR 1.02 04/23/2013   HGBA1C 5.5 04/22/2013    Echo: 07/25/13:Study Conclusions  - Left ventricle: The cavity size was mildly dilated. Wall thickness was normal. Systolic function was severely reduced. The estimated ejection fraction was in the range of 25% to 30%. Akinesis of the mid-apicalanteroseptal myocardium. - Aortic valve: There was trivial regurgitation. - Mitral valve: There was moderate regurgitation. - Tricuspid valve: There was mild-moderate regurgitation. - Pulmonary arteries: Systolic pressure was mildly increased. PA peak pressure: 38 mm Hg (S).    Assessment / Plan:  1. Anterior STEMI - s/p DES to the LAD - residual disease in the ramus but too small to intervene on - she is doing very well clinically. Will leave her on her current regimen. Continue DAPT for at least one year.  2. HLD - on high dose statin.  3. Ischemic CM - EF is low despite clinically doing well. I felt previously that her hospital EF was overestimated based on cath data. She also had a late presentation so I'm not surprised that EF has not recovered. Will start low dose lisinopril at 2.5 mg daily and titrate as BP allows. Could consider aldactone later if BP allows. Meets criteria for ICD and given large infarct in young patient I think this is appropriate. Has appointment with Dr. Caryl Comes to discuss. She is very anxious about these findings but I have encouraged her to remain active and I don't see any limitations at this time.

## 2013-08-15 ENCOUNTER — Encounter: Payer: Self-pay | Admitting: Internal Medicine

## 2013-08-15 ENCOUNTER — Ambulatory Visit (INDEPENDENT_AMBULATORY_CARE_PROVIDER_SITE_OTHER): Payer: PRIVATE HEALTH INSURANCE | Admitting: Internal Medicine

## 2013-08-15 VITALS — BP 113/79 | HR 57 | Ht 61.0 in | Wt 132.0 lb

## 2013-08-15 DIAGNOSIS — I2589 Other forms of chronic ischemic heart disease: Secondary | ICD-10-CM

## 2013-08-15 DIAGNOSIS — I255 Ischemic cardiomyopathy: Secondary | ICD-10-CM

## 2013-08-15 MED ORDER — CARVEDILOL 3.125 MG PO TABS: 3.1250 mg | ORAL_TABLET | Freq: Two times a day (BID) | ORAL | Status: DC

## 2013-08-15 NOTE — Patient Instructions (Signed)
Your physician has recommended you make the following change in your medication:  1) STOP Metoprolol 2) START Carvedilol 3.125 mg twice daily  Please come Monday at 9:00 am to meet with Potter, for subcutaneous ICD screening.

## 2013-08-15 NOTE — Progress Notes (Signed)
ELECTROPHYSIOLOGY CONSULT NOTE  Patient ID: Misty Griffin, MRN: [id number], DOB/AGE: 09/18/1965 49 y.o. Admit date: (Not on file) Date of Consult: 08/15/2013  Primary Physician: Wonda Cerise, MD Primary Cardiologist: PJ  Chief Complaint ICD  HPI Misty Griffin is a 48 y.o. female  Referred for consideration of an ICD.  She presented 3/15 with chest pain. Catheterization demonstrated an occluded LAD which was treated with a DES. She was treated with dual antiplatelet therapy beta blockers and soft and subsequently ACE inhibitors. Ejection fraction reassessed June 2015 demonstrated EF of 25-30%   She's had some palpitations. These are brief rapid and irregular. Functional status is surprisingly good. She is able to ride her bicycle. He has not had any peripheral edema. There has been no syncope  There is a strong family history of coronary disease in the women. Her mother was recently diagnosed with AV block   Past Medical History  Diagnosis Date  . Hemorrhoids   . Vitamin D deficiency   . STEMI (ST elevation myocardial infarction) 04/22/13    anterior; s/p PCI to LAD  . CAD (coronary artery disease) 04/22/13    s/p PCI + DES to proximal LAD; severe disease in a small Ramus Branch, not amendable to PCI  . Systolic dysfunction, left ventricle 3/35/15    EF of 40-45% by TTE  . Diastolic dysfunction 04/24/69    grade I      Surgical History:  Past Surgical History  Procedure Laterality Date  . Cesarean section  1993, 2000     Home Meds: Prior to Admission medications   Medication Sig Start Date End Date Taking? Authorizing Provider  aspirin 81 MG chewable tablet Chew 1 tablet (81 mg total) by mouth daily. 04/25/13  Yes Brittainy Simmons, PA-C  atorvastatin (LIPITOR) 80 MG tablet Take 0.5 tablets (40 mg total) by mouth daily at 6 PM. 05/30/13  Yes Burtis Junes, NP  cetirizine (ZYRTEC) 10 MG tablet Take 10 mg by mouth at bedtime as needed for allergies.   Yes  Historical Provider, MD  lisinopril (PRINIVIL,ZESTRIL) 2.5 MG tablet Take 1 tablet (2.5 mg total) by mouth daily. 08/04/13  Yes Peter M Martinique, MD  magnesium hydroxide (MILK OF MAGNESIA) 400 MG/5ML suspension Take 15-30 mLs by mouth daily as needed for moderate constipation.   Yes Historical Provider, MD  metoprolol tartrate (LOPRESSOR) 25 MG tablet Take 0.5 tablets (12.5 mg total) by mouth 2 (two) times daily. 04/25/13  Yes Brittainy Simmons, PA-C  MULTIPLE VITAMIN PO Take 1 tablet by mouth daily.    Yes Historical Provider, MD  nitroGLYCERIN (NITROSTAT) 0.4 MG SL tablet Place 1 tablet (0.4 mg total) under the tongue every 5 (five) minutes x 3 doses as needed for chest pain. 04/25/13  Yes Brittainy Simmons, PA-C  polyethylene glycol (MIRALAX / GLYCOLAX) packet Take 17 g by mouth daily as needed for moderate constipation.   Yes Historical Provider, MD  prasugrel (EFFIENT) 10 MG TABS tablet Take 1 tablet (10 mg total) by mouth daily. 04/25/13  Yes Brittainy Rosita Fire, PA-C    Allergies:  Allergies  Allergen Reactions  . Miconazole Nitrate Other (See Comments)    Abrasion feeling, "sandpaper" feeling    History   Social History  . Marital Status: Single    Spouse Name: N/A    Number of Children: 2  . Years of Education: N/A   Occupational History  . Glass blower/designer    Social History Main Topics  . Smoking status:  Never Smoker   . Smokeless tobacco: Never Used  . Alcohol Use: No  . Drug Use: No  . Sexual Activity: Not on file   Other Topics Concern  . Not on file   Social History Narrative  . No narrative on file     Family History  Problem Relation Age of Onset  . Colon polyps Mother   . Heart attack Maternal Aunt   . Heart attack Maternal Grandmother   . Heart attack Maternal Grandfather   . Heart attack Paternal Grandfather      ROS:  Please see the history of present illness.     All other systems reviewed and negative.    Physical Exam: Blood pressure 113/79, pulse  57, height 5\' 1"  (1.549 m), weight 132 lb (59.875 kg). General: Well developed, well nourished female in no acute distress. Head: Normocephalic, atraumatic, sclera non-icteric, no xanthomas, nares are without discharge. EENT: normal Lymph Nodes:  none Back: without scoliosis/kyphosis, no CVA tendersness Neck: Negative for carotid bruits. JVD not elevated. Lungs: Clear bilaterally to auscultation without wheezes, rales, or rhonchi. Breathing is unlabored. Heart: RRR with S1 S2. No  murmur , rubs, or gallops appreciated. Abdomen: Soft, non-tender, non-distended with normoactive bowel sounds. No hepatomegaly. No rebound/guarding. No obvious abdominal masses. Msk:  Strength and tone appear normal for age. Extremities: No clubbing or cyanosis. No edema.  Distal pedal pulses are 2+ and equal bilaterally. Skin: Warm and Dry Neuro: Alert and oriented X 3. CN III-XII intact Grossly normal sensory and motor function . Psych:  Responds to questions appropriately with a normal affect.      Labs: Cardiac Enzymes No results found for this basename: CKTOTAL, CKMB, TROPONINI,  in the last 72 hours CBC Lab Results  Component Value Date   WBC 5.4 05/13/2013   HGB 13.0 05/13/2013   HCT 38.6 05/13/2013   MCV 87.8 05/13/2013   PLT 244.0 05/13/2013   PROTIME: No results found for this basename: LABPROT, INR,  in the last 72 hours Chemistry No results found for this basename: NA, K, CL, CO2, BUN, CREATININE, CALCIUM, LABALBU, PROT, BILITOT, ALKPHOS, ALT, AST, GLUCOSE,  in the last 168 hours Lipids Lab Results  Component Value Date   CHOL 100 05/30/2013   HDL 52.40 05/30/2013   LDLCALC 37 05/30/2013   TRIG 53.0 05/30/2013   BNP No results found for this basename: probnp   Miscellaneous No results found for this basename: DDIMER    Radiology/Studies:  No results found.  EKG: Sinus at 57 Intervals 16/10/43 prior septal MI   Assessment and Plan:   Ischemic heart myopathy  Congestive heart  failure-chronic-systolic  Sinus bradycardia  Saline breast implants  The patient has persistent left ventricular dysfunction now 3 months following anterior wall MI associated with late presentation of an LAD occlusion. Thankfully she has minimal symptoms. She has sinus bradycardia but this does not seem to impair her exercise tolerance.  Base of the COMET trial we will change her metoprolol tartrate--carvedilol   She is appropriately considered for ICD implantation  We spent a long time discussing subcutaneous for this transvenous ICD with the benefits of the latter including battery longevity dual-chamber pacing capabilities with her sinus bradycardia. The benefits the former would include a nonvascular device with the implications related to infection and lead fracture. She will consider the options. We will plan to map her for subcutaneous ICD. We will need to consider her breast implants relative to any surgery and would anticipate a  transvenous device being placed in the subpectoral pocket          Virl Axe  In a

## 2013-08-18 ENCOUNTER — Encounter: Payer: Self-pay | Admitting: Internal Medicine

## 2013-08-25 ENCOUNTER — Telehealth: Payer: Self-pay | Admitting: Internal Medicine

## 2013-08-25 NOTE — Telephone Encounter (Signed)
Spoke with pt who states Sherri was looking into which defib her insurance would cover.  Advised Venida Jarvis is not in today but will be in the rest of the week.  Pt would like a call back from [facility] about what she has determined and to schedule the procedure.  Will forward to her to follow up.

## 2013-08-25 NOTE — Telephone Encounter (Signed)
New message     Patient calling back to speak with nurse regarding insurance on Defib.

## 2013-08-26 ENCOUNTER — Encounter: Payer: Self-pay | Admitting: *Deleted

## 2013-08-26 ENCOUNTER — Other Ambulatory Visit: Payer: Self-pay | Admitting: *Deleted

## 2013-08-26 DIAGNOSIS — Z01812 Encounter for preprocedural laboratory examination: Secondary | ICD-10-CM

## 2013-08-26 DIAGNOSIS — I255 Ischemic cardiomyopathy: Secondary | ICD-10-CM

## 2013-08-26 NOTE — Telephone Encounter (Signed)
Scheduled S-ICD for 8/6 w/ anesthesia. Pre procedure lab 7/30 Wound check 8/19. Reviewed letter of instructions with patient and left at front desk for pick up. Patient verbalized understanding and agreeable to plan.

## 2013-08-28 ENCOUNTER — Other Ambulatory Visit (INDEPENDENT_AMBULATORY_CARE_PROVIDER_SITE_OTHER): Payer: PRIVATE HEALTH INSURANCE

## 2013-08-28 ENCOUNTER — Encounter (HOSPITAL_COMMUNITY): Payer: Self-pay | Admitting: Pharmacy Technician

## 2013-08-28 DIAGNOSIS — I2589 Other forms of chronic ischemic heart disease: Secondary | ICD-10-CM

## 2013-08-28 DIAGNOSIS — I255 Ischemic cardiomyopathy: Secondary | ICD-10-CM

## 2013-08-28 DIAGNOSIS — Z01812 Encounter for preprocedural laboratory examination: Secondary | ICD-10-CM

## 2013-08-28 LAB — BASIC METABOLIC PANEL
BUN: 12 mg/dL (ref 6–23)
CHLORIDE: 106 meq/L (ref 96–112)
CO2: 29 mEq/L (ref 19–32)
Calcium: 9.2 mg/dL (ref 8.4–10.5)
Creatinine, Ser: 0.7 mg/dL (ref 0.4–1.2)
GFR: 100.35 mL/min (ref >60.00)
GLUCOSE: 67 mg/dL — AB (ref 70–99)
POTASSIUM: 3.6 meq/L (ref 3.5–5.1)
SODIUM: 139 meq/L (ref 135–145)

## 2013-08-28 LAB — CBC WITH DIFFERENTIAL/PLATELET
BASOS ABS: 0 10*3/uL (ref 0.0–0.1)
Basophils Relative: 0.3 % (ref 0.0–3.0)
Eosinophils Absolute: 0 10*3/uL (ref 0.0–0.7)
Eosinophils Relative: 0.9 % (ref 0.0–5.0)
HCT: 37.2 % (ref 36.0–46.0)
Hemoglobin: 12.7 g/dL (ref 12.0–15.0)
LYMPHS ABS: 2.1 10*3/uL (ref 0.7–4.0)
Lymphocytes Relative: 37.8 % (ref 12.0–46.0)
MCHC: 34.2 g/dL (ref 30.0–36.0)
MCV: 88 fl (ref 78.0–100.0)
MONO ABS: 0.3 10*3/uL (ref 0.1–1.0)
Monocytes Relative: 6.1 % (ref 3.0–12.0)
Neutro Abs: 3.1 10*3/uL (ref 1.4–7.7)
Neutrophils Relative %: 54.9 % (ref 43.0–77.0)
PLATELETS: 188 10*3/uL (ref 150.0–400.0)
RBC: 4.23 Mil/uL (ref 3.87–5.11)
RDW: 13.9 % (ref 11.5–15.5)
WBC: 5.7 10*3/uL (ref 4.0–10.5)

## 2013-09-03 DIAGNOSIS — Z7902 Long term (current) use of antithrombotics/antiplatelets: Secondary | ICD-10-CM | POA: Diagnosis not present

## 2013-09-03 DIAGNOSIS — I2589 Other forms of chronic ischemic heart disease: Secondary | ICD-10-CM | POA: Diagnosis present

## 2013-09-03 DIAGNOSIS — I2510 Atherosclerotic heart disease of native coronary artery without angina pectoris: Secondary | ICD-10-CM | POA: Diagnosis not present

## 2013-09-03 DIAGNOSIS — Z79899 Other long term (current) drug therapy: Secondary | ICD-10-CM | POA: Diagnosis not present

## 2013-09-03 DIAGNOSIS — Z7982 Long term (current) use of aspirin: Secondary | ICD-10-CM | POA: Diagnosis not present

## 2013-09-03 DIAGNOSIS — I252 Old myocardial infarction: Secondary | ICD-10-CM | POA: Diagnosis not present

## 2013-09-03 DIAGNOSIS — I509 Heart failure, unspecified: Secondary | ICD-10-CM | POA: Diagnosis not present

## 2013-09-03 DIAGNOSIS — I498 Other specified cardiac arrhythmias: Secondary | ICD-10-CM | POA: Diagnosis not present

## 2013-09-03 DIAGNOSIS — I5022 Chronic systolic (congestive) heart failure: Secondary | ICD-10-CM | POA: Diagnosis not present

## 2013-09-03 DIAGNOSIS — Z9861 Coronary angioplasty status: Secondary | ICD-10-CM | POA: Diagnosis not present

## 2013-09-03 DIAGNOSIS — Z8249 Family history of ischemic heart disease and other diseases of the circulatory system: Secondary | ICD-10-CM | POA: Diagnosis not present

## 2013-09-03 MED ORDER — MUPIROCIN 2 % EX OINT
1.0000 "application " | TOPICAL_OINTMENT | Freq: Once | CUTANEOUS | Status: AC
  Administered 2013-09-04: 1 via TOPICAL
  Filled 2013-09-03: qty 22

## 2013-09-03 MED ORDER — GENTAMICIN SULFATE 40 MG/ML IJ SOLN
80.0000 mg | INTRAMUSCULAR | Status: DC
  Filled 2013-09-03: qty 2

## 2013-09-03 MED ORDER — CHLORHEXIDINE GLUCONATE 4 % EX LIQD
60.0000 mL | Freq: Once | CUTANEOUS | Status: DC
  Filled 2013-09-03: qty 60

## 2013-09-03 MED ORDER — CEFAZOLIN SODIUM-DEXTROSE 2-3 GM-% IV SOLR: 2.0000 g | INTRAVENOUS | Status: DC

## 2013-09-03 MED ORDER — SODIUM CHLORIDE 0.9 % IV SOLN
INTRAVENOUS | Status: DC
  Administered 2013-09-04: 12:00:00 via INTRAVENOUS

## 2013-09-04 ENCOUNTER — Ambulatory Visit (HOSPITAL_COMMUNITY): Payer: PRIVATE HEALTH INSURANCE | Admitting: Anesthesiology

## 2013-09-04 ENCOUNTER — Encounter (HOSPITAL_COMMUNITY)
Admission: RE | Disposition: A | Discharge status: Home / Self Care | Payer: Self-pay | Source: Ambulatory Visit | Attending: Internal Medicine

## 2013-09-04 ENCOUNTER — Encounter (HOSPITAL_COMMUNITY): Payer: PRIVATE HEALTH INSURANCE | Admitting: Anesthesiology

## 2013-09-04 ENCOUNTER — Encounter (HOSPITAL_COMMUNITY): Payer: Self-pay | Admitting: Anesthesiology

## 2013-09-04 ENCOUNTER — Ambulatory Visit (HOSPITAL_COMMUNITY)
Admission: RE | Admit: 2013-09-04 | Discharge: 2013-09-05 | Disposition: A | Discharge status: Home / Self Care | Payer: PRIVATE HEALTH INSURANCE | Source: Ambulatory Visit | Attending: Internal Medicine | Admitting: Internal Medicine

## 2013-09-04 DIAGNOSIS — I5022 Chronic systolic (congestive) heart failure: Secondary | ICD-10-CM | POA: Insufficient documentation

## 2013-09-04 DIAGNOSIS — Z7902 Long term (current) use of antithrombotics/antiplatelets: Secondary | ICD-10-CM | POA: Insufficient documentation

## 2013-09-04 DIAGNOSIS — I255 Ischemic cardiomyopathy: Secondary | ICD-10-CM | POA: Diagnosis present

## 2013-09-04 DIAGNOSIS — Z79899 Other long term (current) drug therapy: Secondary | ICD-10-CM | POA: Insufficient documentation

## 2013-09-04 DIAGNOSIS — Z9861 Coronary angioplasty status: Secondary | ICD-10-CM | POA: Insufficient documentation

## 2013-09-04 DIAGNOSIS — I498 Other specified cardiac arrhythmias: Secondary | ICD-10-CM | POA: Insufficient documentation

## 2013-09-04 DIAGNOSIS — I509 Heart failure, unspecified: Secondary | ICD-10-CM | POA: Insufficient documentation

## 2013-09-04 DIAGNOSIS — I2510 Atherosclerotic heart disease of native coronary artery without angina pectoris: Secondary | ICD-10-CM | POA: Insufficient documentation

## 2013-09-04 DIAGNOSIS — I2589 Other forms of chronic ischemic heart disease: Secondary | ICD-10-CM | POA: Diagnosis not present

## 2013-09-04 DIAGNOSIS — I252 Old myocardial infarction: Secondary | ICD-10-CM | POA: Insufficient documentation

## 2013-09-04 DIAGNOSIS — Z7982 Long term (current) use of aspirin: Secondary | ICD-10-CM | POA: Insufficient documentation

## 2013-09-04 DIAGNOSIS — Z8249 Family history of ischemic heart disease and other diseases of the circulatory system: Secondary | ICD-10-CM | POA: Insufficient documentation

## 2013-09-04 HISTORY — PX: CARDIAC DEFIBRILLATOR PLACEMENT: SHX171

## 2013-09-04 HISTORY — PX: IMPLANTABLE CARDIOVERTER DEFIBRILLATOR IMPLANT: SHX5473

## 2013-09-04 HISTORY — DX: Presence of automatic (implantable) cardiac defibrillator: Z95.810

## 2013-09-04 LAB — SURGICAL PCR SCREEN
MRSA, PCR: NEGATIVE
Staphylococcus aureus: NEGATIVE

## 2013-09-04 LAB — GLUCOSE, CAPILLARY: GLUCOSE-CAPILLARY: 164 mg/dL — AB (ref 70–99)

## 2013-09-04 SURGERY — IMPLANTABLE CARDIOVERTER DEFIBRILLATOR IMPLANT
Anesthesia: General

## 2013-09-04 MED ORDER — DEXAMETHASONE SODIUM PHOSPHATE 4 MG/ML IJ SOLN
INTRAMUSCULAR | Status: DC | PRN
  Administered 2013-09-04: 4 mg via INTRAVENOUS

## 2013-09-04 MED ORDER — LIDOCAINE HCL (CARDIAC) 20 MG/ML IV SOLN
INTRAVENOUS | Status: DC | PRN
  Administered 2013-09-04: 60 mg via INTRAVENOUS

## 2013-09-04 MED ORDER — MIDAZOLAM HCL 5 MG/5ML IJ SOLN
INTRAMUSCULAR | Status: DC | PRN
  Administered 2013-09-04: 2 mg via INTRAVENOUS

## 2013-09-04 MED ORDER — FENTANYL CITRATE 0.05 MG/ML IJ SOLN
INTRAMUSCULAR | Status: DC | PRN
  Administered 2013-09-04 (×3): 25 ug via INTRAVENOUS

## 2013-09-04 MED ORDER — CEFAZOLIN SODIUM-DEXTROSE 2-3 GM-% IV SOLR
INTRAVENOUS | Status: AC
  Administered 2013-09-04: 2 g via INTRAVENOUS
  Filled 2013-09-04: qty 50

## 2013-09-04 MED ORDER — EPHEDRINE SULFATE 50 MG/ML IJ SOLN
INTRAMUSCULAR | Status: DC | PRN
  Administered 2013-09-04 (×2): 10 mg via INTRAVENOUS
  Administered 2013-09-04: 5 mg via INTRAVENOUS

## 2013-09-04 MED ORDER — ASPIRIN 81 MG PO CHEW
81.0000 mg | CHEWABLE_TABLET | Freq: Every day | ORAL | Status: DC
  Administered 2013-09-05: 10:00:00 81 mg via ORAL
  Filled 2013-09-04: qty 1

## 2013-09-04 MED ORDER — CARVEDILOL 3.125 MG PO TABS
3.1250 mg | ORAL_TABLET | Freq: Two times a day (BID) | ORAL | Status: DC
  Administered 2013-09-04 – 2013-09-05 (×2): 3.125 mg via ORAL
  Filled 2013-09-04 (×4): qty 1

## 2013-09-04 MED ORDER — ATORVASTATIN CALCIUM 40 MG PO TABS
40.0000 mg | ORAL_TABLET | Freq: Every day | ORAL | Status: DC
  Administered 2013-09-04: 40 mg via ORAL
  Filled 2013-09-04 (×2): qty 1

## 2013-09-04 MED ORDER — PROPOFOL 10 MG/ML IV BOLUS
INTRAVENOUS | Status: DC | PRN
  Administered 2013-09-04: 100 mg via INTRAVENOUS

## 2013-09-04 MED ORDER — ONDANSETRON HCL 4 MG/2ML IJ SOLN: 4.0000 mg | Freq: Four times a day (QID) | INTRAMUSCULAR | Status: DC | PRN

## 2013-09-04 MED ORDER — PRASUGREL HCL 10 MG PO TABS
10.0000 mg | ORAL_TABLET | Freq: Every day | ORAL | Status: DC
  Administered 2013-09-05: 10:00:00 10 mg via ORAL
  Filled 2013-09-04: qty 1

## 2013-09-04 MED ORDER — CEFAZOLIN SODIUM 1-5 GM-% IV SOLN
1.0000 g | Freq: Four times a day (QID) | INTRAVENOUS | Status: AC
  Administered 2013-09-04 – 2013-09-05 (×3): 1 g via INTRAVENOUS
  Filled 2013-09-04 (×3): qty 50

## 2013-09-04 MED ORDER — DIPHENHYDRAMINE HCL 25 MG PO CAPS: 25.0000 mg | ORAL_CAPSULE | Freq: Four times a day (QID) | ORAL | Status: DC | PRN

## 2013-09-04 MED ORDER — OXYCODONE HCL 5 MG/5ML PO SOLN: 5.0000 mg | Freq: Once | ORAL | Status: AC | PRN

## 2013-09-04 MED ORDER — MUPIROCIN 2 % EX OINT
TOPICAL_OINTMENT | CUTANEOUS | Status: AC
  Administered 2013-09-04: 1 via TOPICAL
  Filled 2013-09-04: qty 22

## 2013-09-04 MED ORDER — SODIUM CHLORIDE 0.9 % IV SOLN: INTRAVENOUS | Status: AC

## 2013-09-04 MED ORDER — OXYCODONE HCL 5 MG PO TABS: 5.0000 mg | ORAL_TABLET | Freq: Once | ORAL | Status: AC | PRN

## 2013-09-04 MED ORDER — MAGNESIUM HYDROXIDE 400 MG/5ML PO SUSP: 15.0000 mL | Freq: Every day | ORAL | Status: DC | PRN

## 2013-09-04 MED ORDER — FENTANYL CITRATE 0.05 MG/ML IJ SOLN: 25.0000 ug | INTRAMUSCULAR | Status: DC | PRN

## 2013-09-04 MED ORDER — LISINOPRIL 2.5 MG PO TABS
2.5000 mg | ORAL_TABLET | Freq: Every day | ORAL | Status: DC
  Filled 2013-09-04 (×2): qty 1

## 2013-09-04 MED ORDER — BUPIVACAINE HCL (PF) 0.25 % IJ SOLN
INTRAMUSCULAR | Status: AC
  Filled 2013-09-04: qty 60

## 2013-09-04 MED ORDER — NITROGLYCERIN 0.4 MG SL SUBL: 0.4000 mg | SUBLINGUAL_TABLET | SUBLINGUAL | Status: DC | PRN

## 2013-09-04 MED ORDER — ACETAMINOPHEN 325 MG PO TABS
325.0000 mg | ORAL_TABLET | ORAL | Status: DC | PRN
  Administered 2013-09-04 – 2013-09-05 (×2): 650 mg via ORAL
  Filled 2013-09-04 (×2): qty 2

## 2013-09-04 NOTE — CV Procedure (Signed)
Misty Griffin [id number]  [id number]  Preop WL:[id number] cardiomyopathy Postop Dx same/   Procedure:subcutaneous ICD implant with DFT testing  Cx: None   Dictation number [id number]  Virl Axe, MD 09/04/2013 3:34 PM

## 2013-09-04 NOTE — Care Management Note (Addendum)
  Page 2 of 2   09/05/2013     1:57:42 PM CARE MANAGEMENT NOTE 09/05/2013  Patient:  Misty Griffin, Misty Griffin   Account Number:  [id number]  Date Initiated:  09/04/2013  Documentation initiated by:  Mariann Laster  Subjective/Objective Assessment:   Ischemic Cardiomyopathy     Action/Plan:   CM to follow for disposition needs   Anticipated DC Date:  09/05/2013   Anticipated DC Plan:  [place]  CM consult  Medication Assistance      [facility] Choice  NA   Choice offered to / List presented to:             Status of service:  Completed, signed off Medicare Important Message given?   (If response is "NO", the following Medicare IM given date fields will be blank) Date Medicare IM given:   Medicare IM given by:   Date Additional Medicare IM given:   Additional Medicare IM given by:    Discharge Disposition:  HOME/SELF CARE  Per UR Regulation:    If discussed at Long Length of Stay Meetings, dates discussed:    Comments:  Jestina Stephani RN, BSN, MSHL, CCM  Nurse - Case Manager, (Unit [phone]  09/05/2013 Adm:  Ischemic Cardiomyopathy, CAD, Hx/o MI 03/2013 and ICD implant this admission 09/04/2013 Social:  From home with children.  Patient still working. Meds:  Eliquis - active prior to this admission - has already used 30 day free card and $10.00 co-pay card option. Benefits check:  Hx/o per [facility] # [facility]  # [phone] OPT-3  EFFIENT  TABLET 10 MG COVER - YES CO-PAY- $ 30.00 FOR 30 DAYS SUPPLY PRIOR APPROVAL -NO PHARMACY: ANY  STANDARD Disposition Plan:  Home / Self care.    Glenn Gullickson RN, BSN, MSHL, CCM  Nurse - Case Manager, (Unit [phone]  09/04/2013 Benefits check prasugrel (EFFIENT) tablet 10 mg  :  Dose 10 mg  :  Oral  : Daily Coverage, co-pay, authorizations, deductibles, pharmacy requirements.  Adm:  Ischemic Cardiomyopathy, CAD, Hx/o MI 03/2013 and ICD implant this admission 09/04/2013 Social:  From home  with children.  Patient still working. Meds:  Eliquis - active prior to this admission - has already used 30 day free card and $10.00 co-pay card option. Disposition Plan:  Home / Self care.

## 2013-09-04 NOTE — H&P (View-Only) (Signed)
ELECTROPHYSIOLOGY CONSULT NOTE  Patient ID: Misty Griffin, MRN: [id number], DOB/AGE: 09/02/1965 48 y.o. Admit date: (Not on file) Date of Consult: 08/15/2013  Primary Physician: Wonda Cerise, MD Primary Cardiologist: PJ  Chief Complaint ICD  HPI Misty Griffin is a 48 y.o. female  Referred for consideration of an ICD.  She presented 3/15 with chest pain. Catheterization demonstrated an occluded LAD which was treated with a DES. She was treated with dual antiplatelet therapy beta blockers and soft and subsequently ACE inhibitors. Ejection fraction reassessed June 2015 demonstrated EF of 25-30%   She's had some palpitations. These are brief rapid and irregular. Functional status is surprisingly good. She is able to ride her bicycle. He has not had any peripheral edema. There has been no syncope  There is a strong family history of coronary disease in the women. Her mother was recently diagnosed with AV block   Past Medical History  Diagnosis Date  . Hemorrhoids   . Vitamin D deficiency   . STEMI (ST elevation myocardial infarction) 04/22/13    anterior; s/p PCI to LAD  . CAD (coronary artery disease) 04/22/13    s/p PCI + DES to proximal LAD; severe disease in a small Ramus Branch, not amendable to PCI  . Systolic dysfunction, left ventricle 3/35/15    EF of 40-45% by TTE  . Diastolic dysfunction 02/18/12    grade I      Surgical History:  Past Surgical History  Procedure Laterality Date  . Cesarean section  1993, 2000     Home Meds: Prior to Admission medications   Medication Sig Start Date End Date Taking? Authorizing Provider  aspirin 81 MG chewable tablet Chew 1 tablet (81 mg total) by mouth daily. 04/25/13  Yes Brittainy Simmons, PA-C  atorvastatin (LIPITOR) 80 MG tablet Take 0.5 tablets (40 mg total) by mouth daily at 6 PM. 05/30/13  Yes Burtis Junes, NP  cetirizine (ZYRTEC) 10 MG tablet Take 10 mg by mouth at bedtime as needed for allergies.   Yes  Historical Provider, MD  lisinopril (PRINIVIL,ZESTRIL) 2.5 MG tablet Take 1 tablet (2.5 mg total) by mouth daily. 08/04/13  Yes Peter M Martinique, MD  magnesium hydroxide (MILK OF MAGNESIA) 400 MG/5ML suspension Take 15-30 mLs by mouth daily as needed for moderate constipation.   Yes Historical Provider, MD  metoprolol tartrate (LOPRESSOR) 25 MG tablet Take 0.5 tablets (12.5 mg total) by mouth 2 (two) times daily. 04/25/13  Yes Brittainy Simmons, PA-C  MULTIPLE VITAMIN PO Take 1 tablet by mouth daily.    Yes Historical Provider, MD  nitroGLYCERIN (NITROSTAT) 0.4 MG SL tablet Place 1 tablet (0.4 mg total) under the tongue every 5 (five) minutes x 3 doses as needed for chest pain. 04/25/13  Yes Brittainy Simmons, PA-C  polyethylene glycol (MIRALAX / GLYCOLAX) packet Take 17 g by mouth daily as needed for moderate constipation.   Yes Historical Provider, MD  prasugrel (EFFIENT) 10 MG TABS tablet Take 1 tablet (10 mg total) by mouth daily. 04/25/13  Yes Brittainy Rosita Fire, PA-C    Allergies:  Allergies  Allergen Reactions  . Miconazole Nitrate Other (See Comments)    Abrasion feeling, "sandpaper" feeling    History   Social History  . Marital Status: Single    Spouse Name: N/A    Number of Children: 2  . Years of Education: N/A   Occupational History  . Glass blower/designer    Social History Main Topics  . Smoking status:  Never Smoker   . Smokeless tobacco: Never Used  . Alcohol Use: No  . Drug Use: No  . Sexual Activity: Not on file   Other Topics Concern  . Not on file   Social History Narrative  . No narrative on file     Family History  Problem Relation Age of Onset  . Colon polyps Mother   . Heart attack Maternal Aunt   . Heart attack Maternal Grandmother   . Heart attack Maternal Grandfather   . Heart attack Paternal Grandfather      ROS:  Please see the history of present illness.     All other systems reviewed and negative.    Physical Exam: Blood pressure 113/79, pulse  57, height 5\' 1"  (1.549 m), weight 132 lb (59.875 kg). General: Well developed, well nourished female in no acute distress. Head: Normocephalic, atraumatic, sclera non-icteric, no xanthomas, nares are without discharge. EENT: normal Lymph Nodes:  none Back: without scoliosis/kyphosis, no CVA tendersness Neck: Negative for carotid bruits. JVD not elevated. Lungs: Clear bilaterally to auscultation without wheezes, rales, or rhonchi. Breathing is unlabored. Heart: RRR with S1 S2. No  murmur , rubs, or gallops appreciated. Abdomen: Soft, non-tender, non-distended with normoactive bowel sounds. No hepatomegaly. No rebound/guarding. No obvious abdominal masses. Msk:  Strength and tone appear normal for age. Extremities: No clubbing or cyanosis. No edema.  Distal pedal pulses are 2+ and equal bilaterally. Skin: Warm and Dry Neuro: Alert and oriented X 3. CN III-XII intact Grossly normal sensory and motor function . Psych:  Responds to questions appropriately with a normal affect.      Labs: Cardiac Enzymes No results found for this basename: CKTOTAL, CKMB, TROPONINI,  in the last 72 hours CBC Lab Results  Component Value Date   WBC 5.4 05/13/2013   HGB 13.0 05/13/2013   HCT 38.6 05/13/2013   MCV 87.8 05/13/2013   PLT 244.0 05/13/2013   PROTIME: No results found for this basename: LABPROT, INR,  in the last 72 hours Chemistry No results found for this basename: NA, K, CL, CO2, BUN, CREATININE, CALCIUM, LABALBU, PROT, BILITOT, ALKPHOS, ALT, AST, GLUCOSE,  in the last 168 hours Lipids Lab Results  Component Value Date   CHOL 100 05/30/2013   HDL 52.40 05/30/2013   LDLCALC 37 05/30/2013   TRIG 53.0 05/30/2013   BNP No results found for this basename: probnp   Miscellaneous No results found for this basename: DDIMER    Radiology/Studies:  No results found.  EKG: Sinus at 57 Intervals 16/10/43 prior septal MI   Assessment and Plan:   Ischemic heart myopathy  Congestive heart  failure-chronic-systolic  Sinus bradycardia  Saline breast implants  The patient has persistent left ventricular dysfunction now 3 months following anterior wall MI associated with late presentation of an LAD occlusion. Thankfully she has minimal symptoms. She has sinus bradycardia but this does not seem to impair her exercise tolerance.  Base of the COMET trial we will change her metoprolol tartrate--carvedilol   She is appropriately considered for ICD implantation  We spent a long time discussing subcutaneous for this transvenous ICD with the benefits of the latter including battery longevity dual-chamber pacing capabilities with her sinus bradycardia. The benefits the former would include a nonvascular device with the implications related to infection and lead fracture. She will consider the options. We will plan to map her for subcutaneous ICD. We will need to consider her breast implants relative to any surgery and would anticipate a  transvenous device being placed in the subpectoral pocket          Virl Axe  In a

## 2013-09-04 NOTE — Interval H&P Note (Signed)
ICD Criteria  Current LVEF:25% ;Obtained > or = 1 month ago and < or = 3 months ago.  NYHA Functional Classification: Class I  Heart Failure History:  No.  Non-Ischemic Dilated Cardiomyopathy History:  No.  Atrial Fibrillation/Atrial Flutter:  No.  Ventricular Tachycardia History:  No.  Cardiac Arrest History:  No  History of Syndromes with Risk of Sudden Death:  No.  Previous ICD:  No.  Electrophysiology Study: No.  Prior MI: Yes, Most recent MI timeframe is > 40 days.  PPM: No.  OSA:  No  Patient Life Expectancy of >=1 year: Yes.  Anticoagulation Therapy:  Patient is NOT on anticoagulation therapy.   Beta Blocker Therapy:  Yes.   Ace Inhibitor/ARB Therapy:  Yes. History and Physical Interval Note:  09/04/2013 12:05 PM  Misty Griffin  has presented today for surgery, with the diagnosis of ACM  The various methods of treatment have been discussed with the patient and family. After consideration of risks, benefits and other options for treatment, the patient has consented to  Procedure(s): SUB Q IMPLANTABLE CARDIOVERTER DEFIBRILLATOR IMPLANT (N/A) as a surgical intervention .  The patient's history has been reviewed, patient examined, no change in status, stable for surgery.  I have reviewed the patient's chart and labs.  Questions were answered to the patient's satisfaction.     Virl Axe

## 2013-09-04 NOTE — Anesthesia Preprocedure Evaluation (Addendum)
Anesthesia Evaluation  Patient identified by MRN, date of birth, ID band Patient awake    Reviewed: Allergy & Precautions, H&P , NPO status , Patient's Chart, lab work & pertinent test results  History of Anesthesia Complications Negative for: history of anesthetic complications  Airway Mallampati: II  Neck ROM: full    Dental   Pulmonary neg pulmonary ROS,          Cardiovascular + CAD, + Past MI and +CHF  06/2013 TTE:  25-30% EF with moderate MR   Neuro/Psych negative neurological ROS  negative psych ROS   GI/Hepatic negative GI ROS, Neg liver ROS,   Endo/Other  negative endocrine ROS  Renal/GU negative Renal ROS     Musculoskeletal negative musculoskeletal ROS (+)   Abdominal   Peds  Hematology negative hematology ROS (+)   Anesthesia Other Findings   Reproductive/Obstetrics negative OB ROS                          Anesthesia Physical Anesthesia Plan  ASA: III  Anesthesia Plan: General   Post-op Pain Management:    Induction: Intravenous  Airway Management Planned: LMA  Additional Equipment:   Intra-op Plan:   Post-operative Plan:   Informed Consent: I have reviewed the patients History and Physical, chart, labs and discussed the procedure including the risks, benefits and alternatives for the proposed anesthesia with the patient or authorized representative who has indicated his/her understanding and acceptance.     Plan Discussed with: CRNA, Anesthesiologist and Surgeon  Anesthesia Plan Comments:         Anesthesia Quick Evaluation

## 2013-09-04 NOTE — Transfer of Care (Signed)
Immediate Anesthesia Transfer of Care Note  Patient: Misty Griffin  Procedure(s) Performed: Procedure(s): SUB Q IMPLANTABLE CARDIOVERTER DEFIBRILLATOR IMPLANT (N/A)  Patient Location: PACU  Anesthesia Type:General  Level of Consciousness: awake, alert , oriented and patient cooperative  Airway & Oxygen Therapy: Patient Spontanous Breathing  Post-op Assessment: Report given to PACU RN and Post -op Vital signs reviewed and stable  Post vital signs: Reviewed and stable  Complications: No apparent anesthesia complications

## 2013-09-04 NOTE — Op Note (Signed)
NAMERACHNA, SCHONBERGER NO.:  [id number]  MEDICAL RECORD NO.:  [id number]  LOCATION:  [id number]                        FACILITY:  [facility]  PHYSICIAN:  Deboraha Sprang, MD, FACCDATE OF BIRTH:  08-26-1965  DATE OF PROCEDURE:  09/04/2013 DATE OF DISCHARGE:                              OPERATIVE REPORT   PREOPERATIVE DIAGNOSES:  Ischemic cardiomyopathy, late post myocardial infarction.  POSTOPERATIVE DIAGNOSES:  Ischemic cardiomyopathy, late post myocardial infarction.  PROCEDURE:  Subcutaneous implantable defibrillator implantation with intraoperative defibrillation threshold testing.  DESCRIPTION OF PROCEDURE:  Following obtaining informed consent, the patient was brought to electrophysiology laboratory and placed on the fluoroscopic table in supine position.  After routine prep and drape, and fluoroscopic mapping, an incision was made along the inferolateral margin of her breast with care to avoid the breast implant.  Most of the initial pocket creation was done using a Bovie, so as not to harm the implant material.  A pocket was formed in the mid axillary space.  A 2 cm incision was then made at the infrasternal margin.  It was carried down to layer of the fascia on the sternum.  Two anchoring sutures were placed.  We then used the tunneler to draw the lead from the lateral pocket to the xiphoid pocket the lead is a model 3010 serial [id number].  We then used an 49- [place] sheath and so as to be able to deploy the lead along the sternum without the need for a suprasternal incision.  The initial efforts were evaluated fluoroscopically and a tunnel was noted to be off the sternum over the left chest.  We then created a new target using a fluoroscopic confirmation and tunneled the lead from the subxiphoid area to the suprasternal area.  The lead was then secured to the sternal fascia and this incision was then closed in its initial layer.  Hemostasis was obtained.  The  lead was then attached to a [place] serial [id number].  These were assessed while the pocket was closed, antibiotics having been copiously irrigated and Surgicel having been placed on the internal and external surface of the device. Pressure was held against the pocket for sometime while it was that the device was prepared for defibrillation threshold testing.  The wound was closed in its initial layer.  Defibrillation threshold testing was then undertaken.  Ventricular fibrillation was induced.  The high-voltage pacing. Following 13 seconds, a 65 joule shock was delivered through a measured resistance of 64 ohms terminating ventricular fibrillation and restoring sinus rhythm.  At this point, the final layers of closure were accomplished on both the end for sternal incision and the lateral inframammary incision.  Dermabond dressings were applied.  The patient tolerated the procedure without apparent complication.  Needle counts, sponge counts, and instrument counts were correct at the end of the procedure according to the staff.     Deboraha Sprang, MD, [facility]     SCK/MEDQ  D:  09/04/2013  T:  09/04/2013  Job:  [id number]

## 2013-09-05 ENCOUNTER — Ambulatory Visit (INDEPENDENT_AMBULATORY_CARE_PROVIDER_SITE_OTHER): Payer: PRIVATE HEALTH INSURANCE | Admitting: *Deleted

## 2013-09-05 ENCOUNTER — Telehealth: Payer: Self-pay | Admitting: *Deleted

## 2013-09-05 ENCOUNTER — Ambulatory Visit (HOSPITAL_COMMUNITY): Payer: PRIVATE HEALTH INSURANCE

## 2013-09-05 DIAGNOSIS — I255 Ischemic cardiomyopathy: Secondary | ICD-10-CM

## 2013-09-05 DIAGNOSIS — I2589 Other forms of chronic ischemic heart disease: Secondary | ICD-10-CM

## 2013-09-05 DIAGNOSIS — I2109 ST elevation (STEMI) myocardial infarction involving other coronary artery of anterior wall: Secondary | ICD-10-CM

## 2013-09-05 DIAGNOSIS — I2102 ST elevation (STEMI) myocardial infarction involving left anterior descending coronary artery: Secondary | ICD-10-CM

## 2013-09-05 MED ORDER — HYDROCODONE-ACETAMINOPHEN 10-325 MG PO TABS: 1.0000 | ORAL_TABLET | Freq: Four times a day (QID) | ORAL | Status: DC | PRN

## 2013-09-05 NOTE — Progress Notes (Signed)
Pressure dressing applied. Patient informed that she will be able to remove pressure dressing on Sunday and take a shower. Patient voiced understanding.

## 2013-09-05 NOTE — Discharge Summary (Signed)
ELECTROPHYSIOLOGY PROCEDURE DISCHARGE SUMMARY    Patient ID: Misty Griffin,  MRN: [id number], DOB/AGE: November 26, 1965 48 y.o.  Admit date: 09/04/2013 Discharge date: 09/05/2013  Primary Care Physician: Wonda Cerise, MD Primary Cardiologist: [place] Electrophysiologist: Caryl Comes  Primary Discharge Diagnosis:  Ischemic cardiomyopathy status post ICD implant this admission  Secondary Discharge Diagnosis:  1.  CAD s/p PCI to LAD   Allergies  Allergen Reactions  . Miconazole Nitrate Other (See Comments)    Abrasion feeling, "sandpaper" feeling     Procedures This Admission:  1.  Implantation of a BSX S-ICD on 09-04-2013 by Dr Caryl Comes.  The patient received a BSX model number Emblem S ICD with model number 4235 lead.  DFT's were successful at 65 J.  There were no immediate post procedure complications. 2.  CXR on 09-04-2013 demonstrated no pneumothorax status post device implantation.   Brief HPI: Misty Griffin is a 48 y.o. female was referred to electrophysiology in the outpatient setting for consideration of ICD implantation.  Past medical history includes CAD and ischemic cardiomyopathy.  The patient has persistent LV dysfunction despite guideline directed therapy.  Risks, benefits, and alternatives to ICD implantation were reviewed with the patient who wished to proceed.   Hospital Course:  The patient was admitted and underwent implantation of a BSX S-ICD with details as outlined above.   She was monitored on telemetry overnight which demonstrated sinus rhythm with multifocal PVC's.  Left chest was without hematoma or ecchymosis.  The device was interrogated and found to be functioning normally.  CXR was obtained and demonstrated no pneumothorax status post device implantation.  Wound care, arm mobility, and restrictions were reviewed with the patient.  Dr Caryl Comes examined the patient and considered them stable for discharge to home.   The patient's discharge medications include an  ACE-I (Lisinopril) and beta blocker (Carvedilol).   Discharge Vitals: Blood pressure 88/52, pulse 60, temperature 97.3 F (36.3 C), temperature source Oral, resp. rate 18, height 5\' 1"  (1.549 m), weight 126 lb 5.2 oz (57.3 kg), SpO2 99.00%.  Well developed and nourished in no acute distress HENT normal Neck supple  t Modest swelling over pocket Clear Regular rate and rhythm, no murmurs or gallops Abd-soft with active BS No Clubbing cyanosis edema Skin-warm and dry A & Oriented  Grossly normal sensory and motor function   Labs:   Lab Results  Component Value Date   WBC 5.7 08/28/2013   HGB 12.7 08/28/2013   HCT 37.2 08/28/2013   MCV 88.0 08/28/2013   PLT 188.0 08/28/2013   No results found for this basename: NA, K, CL, CO2, BUN, CREATININE, CALCIUM, LABALBU, PROT, BILITOT, ALKPHOS, ALT, AST, GLUCOSE,  in the last 168 hours   Discharge Medications:    Medication List    ASK your doctor about these medications       aspirin 81 MG chewable tablet  Chew 1 tablet (81 mg total) by mouth daily.     atorvastatin 80 MG tablet  Commonly known as:  LIPITOR  Take 0.5 tablets (40 mg total) by mouth daily at 6 PM.     carvedilol 3.125 MG tablet  Commonly known as:  COREG  Take 1 tablet (3.125 mg total) by mouth 2 (two) times daily.     cetirizine 10 MG tablet  Commonly known as:  ZYRTEC  Take 10 mg by mouth at bedtime as needed for allergies.     diphenhydrAMINE 25 mg capsule  Commonly known as:  BENADRYL  Take 25 mg by mouth every 6 (six) hours as needed for allergies.     lisinopril 2.5 MG tablet  Commonly known as:  PRINIVIL,ZESTRIL  Take 1 tablet (2.5 mg total) by mouth daily.     magnesium hydroxide 400 MG/5ML suspension  Commonly known as:  MILK OF MAGNESIA  Take 15-30 mLs by mouth daily as needed for moderate constipation.     MULTIPLE VITAMIN PO  Take 1 tablet by mouth daily.     nitroGLYCERIN 0.4 MG SL tablet  Commonly known as:  NITROSTAT  Place 1 tablet (0.4  mg total) under the tongue every 5 (five) minutes x 3 doses as needed for chest pain.     prasugrel 10 MG Tabs tablet  Commonly known as:  EFFIENT  Take 1 tablet (10 mg total) by mouth daily.        Disposition:   ok to discharge  Reviewed concern re pocket bleeding on effient  We had some oozing yesterdya Instructions reveiwed  Duration of Discharge Encounter: Greater than 30 minutes including physician time.  Signed,

## 2013-09-05 NOTE — Telephone Encounter (Signed)
Dr Caryl Comes called - he would like pressure dressing applied to S-ICD site as patient is on Effient and unable to stop.  She had already been discharged from the hospital.  Spoke with patient. She will come to [facility] office between 1-2 PM today for pressure dressing.  Ok to remove Sunday night and shower at that time.  Office aware.  Chanetta Marshall, Therapist, sports, BSN

## 2013-09-05 NOTE — Discharge Instructions (Signed)
PLEASE REMEMBER TO BRING ALL OF YOUR MEDICATIONS TO EACH OF YOUR FOLLOW-UP OFFICE VISITS.  PLEASE ATTEND ALL SCHEDULED FOLLOW-UP APPOINTMENTS.   Activity: Increase activity slowly as tolerated. You may shower in 2 days. No lifting over 5 lbs for 1 week. No sexual activity for 1 week.   You May Return to Work: in 1 week (if applicable)  Wound Care: You may wash cath site gently with soap and water. Keep cath site clean and dry. If you notice pain, swelling, bleeding or pus at your cath site, please call [phone].   Refer to this sheet in the next few weeks. These instructions provide you with information on caring for yourself after your procedure. Your caregiver may also give you more specific instructions. Your treatment has been planned according to current medical practices, but problems sometimes occur. Call your caregiver if you have any problems or questions after your procedure. HOME CARE INSTRUCTIONS  You may shower 48 hours after the procedure. Remove the bandage (dressing) and gently wash the site with plain soap and water. Gently pat the site dry.   Do not apply powder or lotion to the site.   Do not sit in a bathtub, swimming pool, or whirlpool for 5 to 7 days.   No bending, squatting, or lifting anything over 10 pounds (4.5 kg) as directed by your caregiver.   Inspect the site at least twice daily.   You may drive 24 hours after the procedure unless otherwise instructed by your caregiver.  What to expect:  Any bruising will usually fade within 1 to 2 weeks.   Blood that collects in the tissue (hematoma) may be painful to the touch. It should usually decrease in size and tenderness within 1 to 2 weeks.  SEEK IMMEDIATE MEDICAL CARE IF:  You have unusual pain at the site or down the affected limb.   You have redness, warmth, swelling, or pain at the site.   You have drainage (other than a small amount of blood on the dressing).   You have chills.   You have a fever  or persistent symptoms for more than 72 hours.   You have a fever and your symptoms suddenly get worse.   Your leg becomes pale, cool, tingly, or numb.   You have heavy bleeding from the site. Hold pressure on the site.  Document Released: 02/18/2010 Document Revised: 01/05/2011 Document Reviewed: 02/18/2010 [facility] Patient Information 2012 [place].

## 2013-09-05 NOTE — Anesthesia Postprocedure Evaluation (Addendum)
Anesthesia Post Note  Patient: Misty Griffin  Procedure(s) Performed: Procedure(s) (LRB): SUB Q IMPLANTABLE CARDIOVERTER DEFIBRILLATOR IMPLANT (N/A)  Anesthesia type: General  Patient location: PACU  Post pain: Pain level controlled and Adequate analgesia  Post assessment: Post-op Vital signs reviewed, Patient's Cardiovascular Status Stable, Respiratory Function Stable, Patent Airway and Pain level controlled  Last Vitals:  Filed Vitals:   09/05/13 0755  BP: 101/67  Pulse: 63  Temp: 36.6 C  Resp: 18    Post vital signs: Reviewed and stable  Level of consciousness: awake, alert  and oriented  Complications: No apparent anesthesia complications

## 2013-09-08 ENCOUNTER — Telehealth: Payer: Self-pay | Admitting: *Deleted

## 2013-09-08 NOTE — Telephone Encounter (Signed)
Patient states that her device looks fine since removing the pressure dressing. She states that the area does not look any bigger than before the dressing was applied. She says that she did notice some redness around the ends of where the tape was, but other than that no issues. She will have a nurse from her job to look at the area today while she's there. Will continue follow up as scheduled.

## 2013-09-17 ENCOUNTER — Ambulatory Visit (INDEPENDENT_AMBULATORY_CARE_PROVIDER_SITE_OTHER): Payer: PRIVATE HEALTH INSURANCE | Admitting: *Deleted

## 2013-09-17 DIAGNOSIS — I5022 Chronic systolic (congestive) heart failure: Secondary | ICD-10-CM

## 2013-09-17 DIAGNOSIS — I2589 Other forms of chronic ischemic heart disease: Secondary | ICD-10-CM

## 2013-09-17 DIAGNOSIS — I255 Ischemic cardiomyopathy: Secondary | ICD-10-CM

## 2013-09-17 DIAGNOSIS — I509 Heart failure, unspecified: Secondary | ICD-10-CM

## 2013-09-17 LAB — MDC_IDC_ENUM_SESS_TYPE_INCLINIC: Implantable Pulse Generator Serial Number: 105131

## 2013-09-17 NOTE — Progress Notes (Signed)
Incision edges approximated, wounds well healed. Normal device function. Device programmed at 3.5V for extra safety margin until 3 month visit. Histogram distribution appropriate for patient and level of activity. No ventricular arrhythmias noted. Patient educated about wound care, arm mobility, lifting restrictions, shock plan. ROV in 3 months with implanting physician.

## 2013-09-24 ENCOUNTER — Encounter: Payer: Self-pay | Admitting: Internal Medicine

## 2013-10-09 ENCOUNTER — Ambulatory Visit (INDEPENDENT_AMBULATORY_CARE_PROVIDER_SITE_OTHER): Payer: PRIVATE HEALTH INSURANCE | Admitting: Cardiology

## 2013-10-09 ENCOUNTER — Encounter: Payer: Self-pay | Admitting: Cardiology

## 2013-10-09 VITALS — BP 118/83 | HR 61 | Ht 61.0 in | Wt 125.8 lb

## 2013-10-09 DIAGNOSIS — I2510 Atherosclerotic heart disease of native coronary artery without angina pectoris: Secondary | ICD-10-CM

## 2013-10-09 DIAGNOSIS — I2589 Other forms of chronic ischemic heart disease: Secondary | ICD-10-CM

## 2013-10-09 DIAGNOSIS — I509 Heart failure, unspecified: Secondary | ICD-10-CM

## 2013-10-09 DIAGNOSIS — I255 Ischemic cardiomyopathy: Secondary | ICD-10-CM

## 2013-10-09 DIAGNOSIS — I5022 Chronic systolic (congestive) heart failure: Secondary | ICD-10-CM

## 2013-10-09 MED ORDER — LISINOPRIL 5 MG PO TABS: 5.0000 mg | ORAL_TABLET | Freq: Every day | ORAL | Status: DC

## 2013-10-09 NOTE — Patient Instructions (Signed)
We will try and increase lisinopril to 5 mg daily. Let us know if your blood pressure gets too low.  Continue your other therapy  I will see you in 6 months.

## 2013-10-09 NOTE — Progress Notes (Signed)
Misty Griffin Date of Birth: 1965-05-15 Medical Record #[id number]  History of Present Illness: Ms. Art is seen back today for a follow up visit. S/p anterior STEMI in March 2015 and had 100% occlusion of the LAD treated with DES. Presentation relatively late. Has severe disease in a very small ramus but too small to intervene on - On DAPT with aspirin and Effient. Statin and beta blocker started. BP too soft initially for ACE. EF on follow up in June was 25-30%. Subsequently had ICD implant in July with Dr. Caryl Comes. No complications.  On follow up today she states she feels great. No chest pain or dyspnea. Denies any fluttering symptoms since ICD placed. Riding her stationary bike 5-8 miles daily. BP was low when she had ICD placed but has been OK at home. Still having a hard time dealing emotionally with her MI. Currently seeing Dr. Jeralene Huff at [facility] for primary care.   Current Outpatient Prescriptions  Medication Sig Dispense Refill  . aspirin 81 MG chewable tablet Chew 1 tablet (81 mg total) by mouth daily.      Marland Kitchen atorvastatin (LIPITOR) 80 MG tablet Take 0.5 tablets (40 mg total) by mouth daily at 6 PM.  30 tablet  5  . carvedilol (COREG) 3.125 MG tablet Take 1 tablet (3.125 mg total) by mouth 2 (two) times daily.  60 tablet  5  . cetirizine (ZYRTEC) 10 MG tablet Take 10 mg by mouth at bedtime as needed for allergies.      . diphenhydrAMINE (BENADRYL) 25 mg capsule Take 25 mg by mouth every 6 (six) hours as needed for allergies.      . magnesium hydroxide (MILK OF MAGNESIA) 400 MG/5ML suspension Take 15-30 mLs by mouth daily as needed for moderate constipation.      . MULTIPLE VITAMIN PO Take 1 tablet by mouth daily.       . nitroGLYCERIN (NITROSTAT) 0.4 MG SL tablet Place 1 tablet (0.4 mg total) under the tongue every 5 (five) minutes x 3 doses as needed for chest pain.  25 tablet  2  . Omega-3 Fatty Acids (FISH OIL) 1000 MG CAPS Take 1 capsule by mouth daily.      . prasugrel  (EFFIENT) 10 MG TABS tablet Take 1 tablet (10 mg total) by mouth daily.  30 tablet  10  . lisinopril (PRINIVIL,ZESTRIL) 5 MG tablet Take 1 tablet (5 mg total) by mouth daily.  30 tablet  11   No current facility-administered medications for this visit.    Allergies  Allergen Reactions  . Miconazole Nitrate Other (See Comments)    Abrasion feeling, "sandpaper" feeling    Past Medical History  Diagnosis Date  . Hemorrhoids   . Vitamin D deficiency   . CAD (coronary artery disease) 04/22/13    s/p PCI + DES to proximal LAD; severe disease in a small Ramus Branch, not amendable to PCI  . Systolic dysfunction, left ventricle 3/35/15    EF of 40-45% by TTE  . Diastolic dysfunction 03/22/23    grade I  . Automatic implantable cardioverter-defibrillator in situ   . STEMI (ST elevation myocardial infarction) 04/22/13    anterior; s/p PCI to LAD    Past Surgical History  Procedure Laterality Date  . Cesarean section  1993; 2000  . Cardiac defibrillator placement  09/04/2013    subcutaneous   . Tubal ligation  2000  . Augmentation mammaplasty Bilateral ~ 1995  . Coronary angioplasty with stent placement  03/2013    "  1"    History  Smoking status  . Never Smoker   Smokeless tobacco  . Never Used    History  Alcohol Use No    Family History  Problem Relation Age of Onset  . Colon polyps Mother   . Heart attack Maternal Aunt   . Heart attack Maternal Grandmother   . Heart attack Maternal Grandfather   . Heart attack Paternal Grandfather     Review of Systems: The review of systems is per the HPI.  All other systems were reviewed and are negative.  Physical Exam: BP 118/83  Pulse 61  Ht 5\' 1"  (1.549 m)  Wt 125 lb 12.8 oz (57.063 kg)  BMI 23.78 kg/m2 Patient is very pleasant and in no acute distress.  Skin is warm and dry. Color is normal.  HEENT is unremarkable. Normocephalic/atraumatic. PERRL. Sclera are nonicteric. Neck is supple. No masses. No JVD. Lungs are clear.  Cardiac exam shows a regular rate and rhythm. Abdomen is soft. Extremities are without edema. Gait and ROM are intact. No gross neurologic deficits noted.  LABORATORY DATA:   Lab Results  Component Value Date   WBC 5.7 08/28/2013   HGB 12.7 08/28/2013   HCT 37.2 08/28/2013   PLT 188.0 08/28/2013   GLUCOSE 67* 08/28/2013   CHOL 100 05/30/2013   TRIG 53.0 05/30/2013   HDL 52.40 05/30/2013   LDLCALC 37 05/30/2013   ALT 28 05/30/2013   AST 29 05/30/2013   NA 139 08/28/2013   K 3.6 08/28/2013   CL 106 08/28/2013   CREATININE 0.7 08/28/2013   BUN 12 08/28/2013   CO2 29 08/28/2013   INR 1.02 04/23/2013   HGBA1C 5.5 04/22/2013    Echo: 07/25/13:Study Conclusions  - Left ventricle: The cavity size was mildly dilated. Wall thickness was normal. Systolic function was severely reduced. The estimated ejection fraction was in the range of 25% to 30%. Akinesis of the mid-apicalanteroseptal myocardium. - Aortic valve: There was trivial regurgitation. - Mitral valve: There was moderate regurgitation. - Tricuspid valve: There was mild-moderate regurgitation. - Pulmonary arteries: Systolic pressure was mildly increased. PA peak pressure: 38 mm Hg (S).    Assessment / Plan:  1. Anterior STEMI - s/p DES to the LAD - residual disease in the ramus but too small to intervene on - she is doing very well clinically. Will leave her on her current regimen. Continue DAPT for at least one year.  2. HLD - on high dose statin.  3. Ischemic CM - EF is low despite clinically doing well.  Will increase lisinopril to 5 mg daily and titrate slowly as BP allows. She is s/p prophylactic ICD.

## 2013-12-04 ENCOUNTER — Encounter: Payer: Self-pay | Admitting: Gastroenterology

## 2013-12-04 ENCOUNTER — Encounter: Payer: Self-pay | Admitting: Physician Assistant

## 2013-12-11 ENCOUNTER — Encounter: Payer: Self-pay | Admitting: Physician Assistant

## 2013-12-11 ENCOUNTER — Ambulatory Visit (INDEPENDENT_AMBULATORY_CARE_PROVIDER_SITE_OTHER): Payer: PRIVATE HEALTH INSURANCE | Admitting: Physician Assistant

## 2013-12-11 VITALS — BP 102/60 | HR 68 | Ht 60.75 in | Wt 120.5 lb

## 2013-12-11 DIAGNOSIS — Z86010 Personal history of colonic polyps: Secondary | ICD-10-CM

## 2013-12-11 DIAGNOSIS — Z7901 Long term (current) use of anticoagulants: Secondary | ICD-10-CM

## 2013-12-11 NOTE — Progress Notes (Signed)
Reviewed and agree with management plan.  Akansha Wyche T. Laityn Bensen, MD FACG 

## 2013-12-11 NOTE — Patient Instructions (Signed)
We will send you a letter when it is time to set up an appointment. CC:  Jefm Petty MD

## 2013-12-11 NOTE — Progress Notes (Signed)
Patient ID: Misty Griffin, female   DOB: September 30, 1965, 48 y.o.   MRN: [id number]     History of Present Illness: this is a follow-up for this pleasant 48 year old female who had a colonoscopy by Dr. Fuller Plan in January 2013. At that time a 2 cm pedunculated polyp was removed from the descending colon, and she was noted to have internal hemorrhoids. She was advised to have her surveillance colonoscopy in 1-2 years.  Misty Griffin status post an anterior STEMI  in March 2015 at which time she had a 100% occlusion of the LAD treated with DES. She subsequently had an ICD implant in July with Dr. Caryl Comes. She is on DAPT with aspirin and Effient, and a statin and beta blocker were started. She has been feeling well with no chest pain or dyspnea on exertion. She has had no fluttering symptoms since ICD was placed she writes her stationary bicycle 5-8 miles daily. Her bowel movements are regular. She has had no bright red blood per rectum and no melena.Review of pathology from her polypectomy revealed that the polyp was a tubular adenoma with no dysplasia.   Past Medical History  Diagnosis Date  . Hemorrhoids   . Vitamin D deficiency   . CAD (coronary artery disease) 04/22/13    s/p PCI + DES to proximal LAD; severe disease in a small Ramus Branch, not amendable to PCI  . Systolic dysfunction, left ventricle 3/35/15    EF of 40-45% by TTE  . Diastolic dysfunction 9/56/21    grade I  . Automatic implantable cardioverter-defibrillator in situ   . STEMI (ST elevation myocardial infarction) 04/22/13    anterior; s/p PCI to LAD  . Colon polyp     Past Surgical History  Procedure Laterality Date  . Cesarean section  1993; 2000  . Cardiac defibrillator placement  09/04/2013    subcutaneous   . Tubal ligation  2000  . Augmentation mammaplasty Bilateral ~ 1995  . Coronary angioplasty with stent placement  03/2013    "1"   Family History  Problem Relation Age of Onset  . Colon polyps Mother   . Heart attack  Maternal Aunt   . Heart attack Maternal Grandmother   . Heart attack Maternal Grandfather   . Heart attack Paternal Grandfather    History  Substance Use Topics  . Smoking status: Never Smoker   . Smokeless tobacco: Never Used  . Alcohol Use: No   Current Outpatient Prescriptions  Medication Sig Dispense Refill  . aspirin 81 MG chewable tablet Chew 1 tablet (81 mg total) by mouth daily.    Marland Kitchen atorvastatin (LIPITOR) 80 MG tablet Take 0.5 tablets (40 mg total) by mouth daily at 6 PM. 30 tablet 5  . carvedilol (COREG) 3.125 MG tablet Take 1 tablet (3.125 mg total) by mouth 2 (two) times daily. 60 tablet 5  . cetirizine (ZYRTEC) 10 MG tablet Take 10 mg by mouth at bedtime as needed for allergies.    . diphenhydrAMINE (BENADRYL) 25 mg capsule Take 25 mg by mouth every 6 (six) hours as needed for allergies.    Marland Kitchen lisinopril (PRINIVIL,ZESTRIL) 5 MG tablet Take 1 tablet (5 mg total) by mouth daily. 30 tablet 11  . magnesium hydroxide (MILK OF MAGNESIA) 400 MG/5ML suspension Take 15-30 mLs by mouth daily as needed for moderate constipation.    . MULTIPLE VITAMIN PO Take 1 tablet by mouth daily.     . nitroGLYCERIN (NITROSTAT) 0.4 MG SL tablet Place 1 tablet (0.4 mg  total) under the tongue every 5 (five) minutes x 3 doses as needed for chest pain. 25 tablet 2  . prasugrel (EFFIENT) 10 MG TABS tablet Take 1 tablet (10 mg total) by mouth daily. 30 tablet 10   No current facility-administered medications for this visit.   Allergies  Allergen Reactions  . Miconazole Nitrate Other (See Comments)    Abrasion feeling, "sandpaper" feeling      Review of Systems: Gen: Denies any fever, chills, sweats, anorexia, fatigue, weakness, malaise, weight loss, and sleep disorder CV: Denies chest pain, angina, palpitations, syncope, orthopnea, PND, peripheral edema, and claudication. Resp: Denies dyspnea at rest, dyspnea with exercise, cough, sputum, wheezing, coughing up blood, and pleurisy. GI: Denies  vomiting blood, jaundice, and fecal incontinence.   Denies dysphagia or odynophagia. GU : Denies urinary burning, blood in urine, urinary frequency, urinary hesitancy, nocturnal urination, and urinary incontinence. MS: Denies joint pain, limitation of movement, and swelling, stiffness, low back pain, extremity pain. Denies muscle weakness, cramps, atrophy.  Derm: Denies rash, itching, dry skin, hives, moles, warts, or unhealing ulcers.  Psych: Denies depression, anxiety, memory loss, suicidal ideation, hallucinations, paranoia, and confusion. Heme: Denies bruising, bleeding, and enlarged lymph nodes. Neuro:  Denies any headaches, dizziness, paresthesia Endo:  Denies any problems with DM, thyroid, adrenal  PROCEDURES: colonoscopy February 22 2011-2 centimeter pedunculated polyp in the descending colon. Internal hemorrhoids.  Physical Exam: General: Pleasant, well developed female in no acute distress Head: Normocephalic and atraumatic Eyes:  sclerae anicteric, conjunctiva pink  Ears: Normal auditory acuity Lungs: Clear throughout to auscultation Heart: Regular rate and rhythm Abdomen: Soft, non distended, non-tender. No masses, no hepatomegaly. Normal bowel sounds Musculoskeletal: Symmetrical with no gross deformities  Extremities: No edema  Neurological: Alert oriented x 4, grossly nonfocal Psychological:  Alert and cooperative. Normal mood and affect  Assessment and Recommendations: 48 year old female with a history of adenomatous colon polyps due for surveillance colonoscopy to evaluate for recurrence. The patient has recently had DES placement and is currently on Effient and aspirin. It was explained to the patient that she needs to be on her Effient for at least a year after placement of her DES before cardiology may lead her come off her Effient for her procedure. The patient is aware that Effient has a seven-day washout. The patient is scheduled to follow with her cardiologist in early  March. She will be put on the recall list to return here in March at which time we will contact her cardiologist to see if she can safely be taken off her Effient for a period of time for repeat colonoscopy.        Jermall Isaacson, Deloris Ping 12/11/2013,

## 2013-12-17 ENCOUNTER — Ambulatory Visit (INDEPENDENT_AMBULATORY_CARE_PROVIDER_SITE_OTHER): Payer: PRIVATE HEALTH INSURANCE | Admitting: Internal Medicine

## 2013-12-17 ENCOUNTER — Encounter: Payer: Self-pay | Admitting: Internal Medicine

## 2013-12-17 VITALS — BP 102/70 | HR 56 | Ht 60.75 in | Wt 123.0 lb

## 2013-12-17 DIAGNOSIS — I255 Ischemic cardiomyopathy: Secondary | ICD-10-CM

## 2013-12-17 DIAGNOSIS — Z4502 Encounter for adjustment and management of automatic implantable cardiac defibrillator: Secondary | ICD-10-CM

## 2013-12-17 DIAGNOSIS — I5022 Chronic systolic (congestive) heart failure: Secondary | ICD-10-CM

## 2013-12-17 LAB — MDC_IDC_ENUM_SESS_TYPE_INCLINIC: Implantable Pulse Generator Serial Number: 105131

## 2013-12-17 NOTE — Progress Notes (Signed)
Patient Care Team: Jefm Petty, MD as PCP - General (Family Medicine)   HPI  Misty Griffin is a 48 y.o. female Seen in follow-up for ICD implantation (S ICD) for primary prevention in the setting of ischemic cardio myopathy  The patient denies chest pain, shortness of breath, nocturnal dyspnea, orthopnea or peripheral edema.  There have been no palpitations, lightheadedness or syncope.    She is accommodating to the protrusion of her defibrillator  Past Medical History  Diagnosis Date  . Hemorrhoids   . Vitamin D deficiency   . CAD (coronary artery disease) 04/22/13    s/p PCI + DES to proximal LAD; severe disease in a small Ramus Branch, not amendable to PCI  . Systolic dysfunction, left ventricle 3/35/15    EF of 40-45% by TTE  . Diastolic dysfunction 0/35/46    grade I  . Automatic implantable cardioverter-defibrillator in situ   . STEMI (ST elevation myocardial infarction) 04/22/13    anterior; s/p PCI to LAD  . Colon polyp     Past Surgical History  Procedure Laterality Date  . Cesarean section  1993; 2000  . Cardiac defibrillator placement  09/04/2013    subcutaneous   . Tubal ligation  2000  . Augmentation mammaplasty Bilateral ~ 1995  . Coronary angioplasty with stent placement  03/2013    "1"    Current Outpatient Prescriptions  Medication Sig Dispense Refill  . aspirin 81 MG chewable tablet Chew 1 tablet (81 mg total) by mouth daily.    Marland Kitchen atorvastatin (LIPITOR) 80 MG tablet Take 0.5 tablets (40 mg total) by mouth daily at 6 PM. 30 tablet 5  . carvedilol (COREG) 3.125 MG tablet Take 1 tablet (3.125 mg total) by mouth 2 (two) times daily. 60 tablet 5  . cetirizine (ZYRTEC) 10 MG tablet Take 10 mg by mouth at bedtime as needed for allergies.    . diphenhydrAMINE (BENADRYL) 25 mg capsule Take 25 mg by mouth every 6 (six) hours as needed for allergies.    Marland Kitchen lisinopril (PRINIVIL,ZESTRIL) 5 MG tablet Take 1 tablet (5 mg total) by mouth daily. 30 tablet 11    . magnesium hydroxide (MILK OF MAGNESIA) 400 MG/5ML suspension Take 15-30 mLs by mouth daily as needed for moderate constipation.    . MULTIPLE VITAMIN PO Take 1 tablet by mouth daily.     . nitroGLYCERIN (NITROSTAT) 0.4 MG SL tablet Place 1 tablet (0.4 mg total) under the tongue every 5 (five) minutes x 3 doses as needed for chest pain. 25 tablet 2  . Omega-3 Fatty Acids (FISH OIL) 1000 MG CPDR Take 1,000 mg by mouth daily.    . prasugrel (EFFIENT) 10 MG TABS tablet Take 1 tablet (10 mg total) by mouth daily. 30 tablet 10   No current facility-administered medications for this visit.    Allergies  Allergen Reactions  . Miconazole Nitrate Other (See Comments)    Abrasion feeling, "sandpaper" feeling    Review of Systems negative except from HPI and PMH  Physical Exam BP 102/70 mmHg  Pulse 56  Ht 5' 0.75" (1.543 m)  Wt 55.792 kg (123 lb)  BMI 23.43 kg/m2 Well developed and well nourished in no acute distress HENT normal E scleral and icterus clear Neck Supple JVP flat; carotids brisk and full Clear to ausculation Device pocket well healed; without hematoma or erythema.  There is no tethering Regular rate and rhythm, no murmurs gallops or rub Soft with active bowel sounds  No clubbing cyanosis  Edema Alert and oriented, grossly normal motor and sensory function Skin Warm and Dry  noECG  Sinus at 56  14/09/41  Assessment and  Plan  Ischemic cardiomyopathy  Stable continue Guideline directed medical therapy limited by hypotension  Defibrillator implantation-subcutaneous  The patient's device was interrogated.  The information was reviewed. No changes were made in the programming.

## 2013-12-19 ENCOUNTER — Encounter: Payer: PRIVATE HEALTH INSURANCE | Admitting: Gastroenterology

## 2013-12-24 ENCOUNTER — Encounter: Payer: Self-pay | Admitting: Internal Medicine

## 2014-01-08 ENCOUNTER — Encounter (HOSPITAL_COMMUNITY): Payer: Self-pay | Admitting: Cardiology

## 2014-02-03 ENCOUNTER — Telehealth: Payer: Self-pay | Admitting: Internal Medicine

## 2014-02-03 NOTE — Telephone Encounter (Signed)
New message      Pt has an ICD but was told she was going to get a "box".  She has not received it.  Calling to check the status

## 2014-02-03 NOTE — Telephone Encounter (Signed)
Spoke w/ pt and informed her home monitor was ordered. She verbalized understanding.

## 2014-02-09 ENCOUNTER — Other Ambulatory Visit: Payer: Self-pay | Admitting: Internal Medicine

## 2014-02-12 ENCOUNTER — Other Ambulatory Visit: Payer: Self-pay | Admitting: *Deleted

## 2014-02-12 MED ORDER — ATORVASTATIN CALCIUM 80 MG PO TABS: 40.0000 mg | ORAL_TABLET | Freq: Every day | ORAL | Status: DC

## 2014-02-17 ENCOUNTER — Encounter: Payer: Self-pay | Admitting: Internal Medicine

## 2014-02-24 ENCOUNTER — Telehealth: Payer: Self-pay | Admitting: Internal Medicine

## 2014-02-24 NOTE — Telephone Encounter (Signed)
New Message  Pt requested to speak w/ someone in the device clinic. Pt stated she does nto have traditional land line and needs an adapter. Please call back and discuss.

## 2014-02-24 NOTE — Telephone Encounter (Signed)
Informed pt that I would order cell adapter. Pt scheduled for appt on 03-24-14 to have device checked in office.

## 2014-02-24 NOTE — Telephone Encounter (Signed)
Per [employer]), communicator to be mailed out for delivery to Ms. Mumford by 02/26/14, patient aware.

## 2014-02-26 ENCOUNTER — Encounter: Payer: Self-pay | Admitting: Gastroenterology

## 2014-03-11 ENCOUNTER — Telehealth: Payer: Self-pay | Admitting: Cardiology

## 2014-03-11 ENCOUNTER — Encounter: Payer: Self-pay | Admitting: Cardiology

## 2014-03-11 NOTE — Telephone Encounter (Signed)
LMOVM informing pt that cell adapter will not arrive until 04-07-14.

## 2014-03-24 ENCOUNTER — Ambulatory Visit (INDEPENDENT_AMBULATORY_CARE_PROVIDER_SITE_OTHER): Payer: PRIVATE HEALTH INSURANCE | Admitting: *Deleted

## 2014-03-24 DIAGNOSIS — I255 Ischemic cardiomyopathy: Secondary | ICD-10-CM

## 2014-03-24 DIAGNOSIS — I5022 Chronic systolic (congestive) heart failure: Secondary | ICD-10-CM

## 2014-03-24 LAB — MDC_IDC_ENUM_SESS_TYPE_INCLINIC: Implantable Pulse Generator Serial Number: 105131

## 2014-03-24 NOTE — Progress Notes (Signed)
Subcutaneous ICD check in clinic. 0 untreated episodes; 0 treated episodes; 0 shocks delivered. Electrode impedance status okay. No programming changes. Remaining longevity to ERI 94%. Latitude 06/22/14 & ROV w/ Dr. Caryl Comes in 33mo.

## 2014-04-08 ENCOUNTER — Other Ambulatory Visit: Payer: Self-pay | Admitting: Internal Medicine

## 2014-04-09 ENCOUNTER — Encounter: Payer: Self-pay | Admitting: Internal Medicine

## 2014-04-14 ENCOUNTER — Other Ambulatory Visit: Payer: Self-pay | Admitting: Cardiology

## 2014-05-11 ENCOUNTER — Other Ambulatory Visit: Payer: Self-pay

## 2014-05-11 MED ORDER — PRASUGREL HCL 10 MG PO TABS: 10.0000 mg | ORAL_TABLET | Freq: Every day | ORAL | Status: DC

## 2014-05-12 ENCOUNTER — Ambulatory Visit (INDEPENDENT_AMBULATORY_CARE_PROVIDER_SITE_OTHER): Payer: PRIVATE HEALTH INSURANCE | Admitting: Cardiology

## 2014-05-12 ENCOUNTER — Encounter: Payer: Self-pay | Admitting: Cardiology

## 2014-05-12 VITALS — BP 100/68 | HR 78 | Ht 61.0 in | Wt 114.5 lb

## 2014-05-12 DIAGNOSIS — I255 Ischemic cardiomyopathy: Secondary | ICD-10-CM

## 2014-05-12 DIAGNOSIS — I5022 Chronic systolic (congestive) heart failure: Secondary | ICD-10-CM

## 2014-05-12 DIAGNOSIS — I2510 Atherosclerotic heart disease of native coronary artery without angina pectoris: Secondary | ICD-10-CM

## 2014-05-12 LAB — HEPATIC FUNCTION PANEL
ALT: 20 U/L (ref 0–35)
AST: 21 U/L (ref 0–37)
Albumin: 4.1 g/dL (ref 3.5–5.2)
Alkaline Phosphatase: 41 U/L (ref 39–117)
BILIRUBIN INDIRECT: 0.4 mg/dL (ref 0.2–1.2)
Bilirubin, Direct: 0.2 mg/dL (ref 0.0–0.3)
Total Bilirubin: 0.6 mg/dL (ref 0.2–1.2)
Total Protein: 6.6 g/dL (ref 6.0–8.3)

## 2014-05-12 LAB — CBC WITH DIFFERENTIAL/PLATELET
Basophils Absolute: 0 10*3/uL (ref 0.0–0.1)
Basophils Relative: 1 % (ref 0–1)
EOS PCT: 2 % (ref 0–5)
Eosinophils Absolute: 0.1 10*3/uL (ref 0.0–0.7)
HCT: 39 % (ref 36.0–46.0)
Hemoglobin: 13.3 g/dL (ref 12.0–15.0)
Lymphocytes Relative: 47 % — ABNORMAL HIGH (ref 12–46)
Lymphs Abs: 1.7 10*3/uL (ref 0.7–4.0)
MCH: 29.6 pg (ref 26.0–34.0)
MCHC: 34.1 g/dL (ref 30.0–36.0)
MCV: 86.9 fL (ref 78.0–100.0)
MONO ABS: 0.2 10*3/uL (ref 0.1–1.0)
MPV: 10.8 fL (ref 8.6–12.4)
Monocytes Relative: 6 % (ref 3–12)
NEUTROS ABS: 1.6 10*3/uL — AB (ref 1.7–7.7)
Neutrophils Relative %: 44 % (ref 43–77)
PLATELETS: 186 10*3/uL (ref 150–400)
RBC: 4.49 MIL/uL (ref 3.87–5.11)
RDW: 13.1 % (ref 11.5–15.5)
WBC: 3.6 10*3/uL — AB (ref 4.0–10.5)

## 2014-05-12 LAB — BASIC METABOLIC PANEL
BUN: 12 mg/dL (ref 6–23)
CALCIUM: 9.2 mg/dL (ref 8.4–10.5)
CO2: 27 mEq/L (ref 19–32)
CREATININE: 0.64 mg/dL (ref 0.50–1.10)
Chloride: 105 mEq/L (ref 96–112)
Glucose, Bld: 83 mg/dL (ref 70–99)
Potassium: 4.5 mEq/L (ref 3.5–5.3)
SODIUM: 140 meq/L (ref 135–145)

## 2014-05-12 LAB — LIPID PANEL
CHOLESTEROL: 112 mg/dL (ref 0–200)
HDL: 63 mg/dL (ref >=46)
LDL Cholesterol: 39 mg/dL (ref 0–99)
TRIGLYCERIDES: 52 mg/dL (ref <150)
Total CHOL/HDL Ratio: 1.8 Ratio
VLDL: 10 mg/dL (ref 0–40)

## 2014-05-12 MED ORDER — ATORVASTATIN CALCIUM 80 MG PO TABS: 40.0000 mg | ORAL_TABLET | Freq: Every day | ORAL | Status: DC

## 2014-05-12 MED ORDER — LOSARTAN POTASSIUM 25 MG PO TABS: 25.0000 mg | ORAL_TABLET | Freq: Every day | ORAL | Status: DC

## 2014-05-12 MED ORDER — CARVEDILOL 3.125 MG PO TABS: 3.1250 mg | ORAL_TABLET | Freq: Two times a day (BID) | ORAL | Status: DC

## 2014-05-12 MED ORDER — NITROGLYCERIN 0.4 MG SL SUBL: 0.4000 mg | SUBLINGUAL_TABLET | SUBLINGUAL | Status: AC | PRN

## 2014-05-12 NOTE — Patient Instructions (Signed)
Stop Effient.  Stop lisinopril  Start losartan 25 mg daily  We will check lab work today  I will see you in 6 months.

## 2014-05-12 NOTE — Progress Notes (Signed)
Misty Griffin Date of Birth: 09-17-1965 Medical Record #[id number]  History of Present Illness: Ms. Sampey is seen back today for follow up CAD. S/p anterior STEMI in March 2015 and had 100% occlusion of the LAD treated with DES. Presentation relatively late. Has severe disease in a very small ramus but too small to intervene on - On DAPT with aspirin and Effient. Statin and beta blocker started.  EF on follow up in June was 25-30%. Subsequently had ICD implant in July with Dr. Caryl Comes. No complications. On her last visit we increased lisinopril to 5 mg but this resulted in a severe cough. She reduce the dose and her cough improved about 40%.  No chest pain or dyspnea. Denies any fluttering symptoms since ICD placed. Riding her stationary bike 10 miles daily. She has lost 9 lbs. Her mood is good. She does have prior breast implants and reports some breakdown of the left implant. Is considering surgery for this.   Current Outpatient Prescriptions  Medication Sig Dispense Refill  . aspirin 81 MG chewable tablet Chew 1 tablet (81 mg total) by mouth daily.    Marland Kitchen atorvastatin (LIPITOR) 80 MG tablet Take 0.5 tablets (40 mg total) by mouth daily at 6 PM. 30 tablet 6  . carvedilol (COREG) 3.125 MG tablet Take 1 tablet (3.125 mg total) by mouth 2 (two) times daily. 60 tablet 6  . cetirizine (ZYRTEC) 10 MG tablet Take 10 mg by mouth at bedtime as needed for allergies.    . diphenhydrAMINE (BENADRYL) 25 mg capsule Take 25 mg by mouth every 6 (six) hours as needed for allergies.    . magnesium hydroxide (MILK OF MAGNESIA) 400 MG/5ML suspension Take 15-30 mLs by mouth daily as needed for moderate constipation.    . MULTIPLE VITAMIN PO Take 1 tablet by mouth daily.     . nitroGLYCERIN (NITROSTAT) 0.4 MG SL tablet Place 1 tablet (0.4 mg total) under the tongue every 5 (five) minutes x 3 doses as needed for chest pain. 25 tablet 11  . Omega-3 Fatty Acids (FISH OIL) 1000 MG CPDR Take 1,000 mg by mouth daily.      Marland Kitchen losartan (COZAAR) 25 MG tablet Take 1 tablet (25 mg total) by mouth daily. 90 tablet 3   No current facility-administered medications for this visit.    Allergies  Allergen Reactions  . Miconazole Nitrate Other (See Comments)    Abrasion feeling, "sandpaper" feeling    Past Medical History  Diagnosis Date  . Hemorrhoids   . Vitamin D deficiency   . CAD (coronary artery disease) 04/22/13    s/p PCI + DES to proximal LAD; severe disease in a small Ramus Branch, not amendable to PCI  . Systolic dysfunction, left ventricle 3/35/15    EF of 25% by TTE  . Diastolic dysfunction 7/62/83    grade I  . ICD-Subcutaneous     BSx  . STEMI (ST elevation myocardial infarction) 04/22/13    anterior; s/p PCI to LAD  . Colon polyp     Past Surgical History  Procedure Laterality Date  . Cesarean section  1993; 2000  . Cardiac defibrillator placement  09/04/2013    subcutaneous   . Tubal ligation  2000  . Augmentation mammaplasty Bilateral ~ 1995  . Coronary angioplasty with stent placement  03/2013    "1"  . Left heart catheterization with coronary angiogram N/A 04/22/2013    Procedure: LEFT HEART CATHETERIZATION WITH CORONARY ANGIOGRAM;  Surgeon: Kylyn Sookram M Martinique, MD;  Location: [facility] CATH LAB;  Service: Cardiovascular;  Laterality: N/A;  . Percutaneous coronary stent intervention (pci-s)  04/22/2013    Procedure: PERCUTANEOUS CORONARY STENT INTERVENTION (PCI-S);  Surgeon: Taiga Lupinacci M Martinique, MD;  Location: [facility] CATH LAB;  Service: Cardiovascular;;  . Implantable cardioverter defibrillator implant N/A 09/04/2013    Procedure: SUB Q IMPLANTABLE CARDIOVERTER DEFIBRILLATOR IMPLANT;  Surgeon: Deboraha Sprang, MD;  Location: [facility] CATH LAB;  Service: Cardiovascular;  Laterality: N/A;    History  Smoking status  . Never Smoker   Smokeless tobacco  . Never Used    History  Alcohol Use No    Family History  Problem Relation Age of Onset  . Colon polyps Mother   . Heart attack Maternal Aunt   . Heart  attack Maternal Grandmother   . Heart attack Maternal Grandfather   . Heart attack Paternal Grandfather     Review of Systems: The review of systems is per the HPI.  All other systems were reviewed and are negative.  Physical Exam: BP 100/68 mmHg  Pulse 78  Ht 5\' 1"  (1.549 m)  Wt 114 lb 8 oz (51.937 kg)  BMI 21.65 kg/m2 Patient is very pleasant and in no acute distress.  Skin is warm and dry. Color is normal.  HEENT is unremarkable. Normocephalic/atraumatic. PERRL. Sclera are nonicteric. Neck is supple. No masses. No JVD. Lungs are clear. Cardiac exam shows a regular rate and rhythm. Abdomen is soft. Extremities are without edema. Gait and ROM are intact. No gross neurologic deficits noted.  LABORATORY DATA:   Lab Results  Component Value Date   WBC 5.7 08/28/2013   HGB 12.7 08/28/2013   HCT 37.2 08/28/2013   PLT 188.0 08/28/2013   GLUCOSE 67* 08/28/2013   CHOL 100 05/30/2013   TRIG 53.0 05/30/2013   HDL 52.40 05/30/2013   LDLCALC 37 05/30/2013   ALT 28 05/30/2013   AST 29 05/30/2013   NA 139 08/28/2013   K 3.6 08/28/2013   CL 106 08/28/2013   CREATININE 0.7 08/28/2013   BUN 12 08/28/2013   CO2 29 08/28/2013   INR 1.02 04/23/2013   HGBA1C 5.5 04/22/2013    Echo: 07/25/13:Study Conclusions  - Left ventricle: The cavity size was mildly dilated. Wall thickness was normal. Systolic function was severely reduced. The estimated ejection fraction was in the range of 25% to 30%. Akinesis of the mid-apicalanteroseptal myocardium. - Aortic valve: There was trivial regurgitation. - Mitral valve: There was moderate regurgitation. - Tricuspid valve: There was mild-moderate regurgitation. - Pulmonary arteries: Systolic pressure was mildly increased. PA peak pressure: 38 mm Hg (S).    Assessment / Plan:  1. Anterior STEMI - s/p DES to the LAD - residual disease in the ramus but too small to intervene on - she is doing very well clinically. Will stop Effient at this point and  continue ASA long term.  2. HLD - on high dose statin. Will check fasting lab work today.  3. Ischemic CM - EF is low but clinically doing well.  She is s/p prophylactic ICD. Intolerant of ACEi due to cough. Will switch lisinopril to losartan 25 mg daily.   I will follow up in 6 months.

## 2014-05-13 ENCOUNTER — Other Ambulatory Visit: Payer: Self-pay

## 2014-05-13 DIAGNOSIS — D72819 Decreased white blood cell count, unspecified: Secondary | ICD-10-CM

## 2014-06-22 ENCOUNTER — Ambulatory Visit (INDEPENDENT_AMBULATORY_CARE_PROVIDER_SITE_OTHER): Payer: PRIVATE HEALTH INSURANCE | Admitting: *Deleted

## 2014-06-22 ENCOUNTER — Telehealth: Payer: Self-pay | Admitting: Cardiology

## 2014-06-22 DIAGNOSIS — I255 Ischemic cardiomyopathy: Secondary | ICD-10-CM

## 2014-06-22 NOTE — Telephone Encounter (Signed)
LMOVM reminding pt to send remote transmission.   

## 2014-06-23 DIAGNOSIS — I255 Ischemic cardiomyopathy: Secondary | ICD-10-CM

## 2014-06-23 LAB — CUP PACEART REMOTE DEVICE CHECK
Date Time Interrogation Session: 20160524042100
MDC IDC MSMT BATTERY REMAINING PERCENTAGE: 90 %
MDC IDC SET ZONE DETECTION INTERVAL: 250 ms
MDC IDC SET ZONE DETECTION INTERVAL: 300 ms
Pulse Gen Serial Number: 105131

## 2014-06-23 NOTE — Progress Notes (Signed)
Remote ICD transmission.   

## 2014-07-08 ENCOUNTER — Encounter: Payer: Self-pay | Admitting: Cardiology

## 2014-07-13 ENCOUNTER — Encounter: Payer: Self-pay | Admitting: Internal Medicine

## 2014-09-22 ENCOUNTER — Encounter: Payer: Self-pay | Admitting: Gastroenterology

## 2014-09-30 ENCOUNTER — Other Ambulatory Visit: Payer: Self-pay | Admitting: *Deleted

## 2014-10-01 ENCOUNTER — Other Ambulatory Visit: Payer: Self-pay | Admitting: *Deleted

## 2014-10-01 MED ORDER — LISINOPRIL 5 MG PO TABS: 5.0000 mg | ORAL_TABLET | Freq: Every day | ORAL | Status: DC

## 2014-11-05 ENCOUNTER — Encounter: Payer: PRIVATE HEALTH INSURANCE | Admitting: Internal Medicine

## 2014-11-10 ENCOUNTER — Encounter: Payer: Self-pay | Admitting: Cardiology

## 2014-11-10 ENCOUNTER — Ambulatory Visit (INDEPENDENT_AMBULATORY_CARE_PROVIDER_SITE_OTHER): Payer: PRIVATE HEALTH INSURANCE | Admitting: Cardiology

## 2014-11-10 VITALS — BP 110/84 | HR 50 | Ht 61.0 in | Wt 114.9 lb

## 2014-11-10 DIAGNOSIS — I255 Ischemic cardiomyopathy: Secondary | ICD-10-CM

## 2014-11-10 DIAGNOSIS — I2510 Atherosclerotic heart disease of native coronary artery without angina pectoris: Secondary | ICD-10-CM

## 2014-11-10 DIAGNOSIS — I5022 Chronic systolic (congestive) heart failure: Secondary | ICD-10-CM | POA: Diagnosis not present

## 2014-11-10 NOTE — Progress Notes (Signed)
Misty Griffin Date of Birth: 02-09-1965 Medical Record #[id number]  History of Present Illness: Misty Griffin is seen back today for follow up CAD. S/p anterior STEMI in March 2015 and had 100% occlusion of the LAD treated with DES. Presentation relatively late. Has severe disease in a very small ramus but too small to intervene on. Statin and beta blocker started.  EF on follow up in June was 25-30%. Subsequently had ICD implant in July with Dr. Caryl Comes. No complications. On her last visit we switched lisinopril to losartan due to cough. On follow up today she states she is doing very well. She is in good spirits.No chest pain or dyspnea. Denies any fluttering symptoms since ICD placed. She exercises daily with walking, running, or stationary bike.  She does have prior breast implants and is considering surgery for reconstruction. She does note BP tends to drop into the 90s in the afternoon but she tolerates this well.  Current Outpatient Prescriptions  Medication Sig Dispense Refill  . aspirin 81 MG chewable tablet Chew 1 tablet (81 mg total) by mouth daily.    Marland Kitchen atorvastatin (LIPITOR) 80 MG tablet Take 0.5 tablets (40 mg total) by mouth daily at 6 PM. 30 tablet 6  . carvedilol (COREG) 3.125 MG tablet Take 1 tablet (3.125 mg total) by mouth 2 (two) times daily. 60 tablet 6  . cetirizine (ZYRTEC) 10 MG tablet Take 10 mg by mouth at bedtime as needed for allergies.    . diphenhydrAMINE (BENADRYL) 25 mg capsule Take 25 mg by mouth every 6 (six) hours as needed for allergies.    Marland Kitchen losartan (COZAAR) 25 MG tablet Take 1 tablet (25 mg total) by mouth daily. 90 tablet 3  . magnesium hydroxide (MILK OF MAGNESIA) 400 MG/5ML suspension Take 15-30 mLs by mouth daily as needed for moderate constipation.    . MULTIPLE VITAMIN PO Take 1 tablet by mouth daily.     . nitroGLYCERIN (NITROSTAT) 0.4 MG SL tablet Place 1 tablet (0.4 mg total) under the tongue every 5 (five) minutes x 3 doses as needed for chest  pain. 25 tablet 11  . Omega-3 Fatty Acids (FISH OIL) 1000 MG CPDR Take 1,000 mg by mouth daily.    . Wheat Dextrin (BENEFIBER DRINK MIX PO) Take 1 scoop by mouth daily.     No current facility-administered medications for this visit.    Allergies  Allergen Reactions  . Miconazole Nitrate Other (See Comments)    Abrasion feeling, "sandpaper" feeling    Past Medical History  Diagnosis Date  . Hemorrhoids   . Vitamin D deficiency   . CAD (coronary artery disease) 04/22/13    s/p PCI + DES to proximal LAD; severe disease in a small Ramus Branch, not amendable to PCI  . Systolic dysfunction, left ventricle 3/35/15    EF of 25% by TTE  . Diastolic dysfunction 3/55/73    grade I  . ICD-Subcutaneous     BSx  . STEMI (ST elevation myocardial infarction) ([place]) 04/22/13    anterior; s/p PCI to LAD  . Colon polyp     Past Surgical History  Procedure Laterality Date  . Cesarean section  1993; 2000  . Cardiac defibrillator placement  09/04/2013    subcutaneous   . Tubal ligation  2000  . Augmentation mammaplasty Bilateral ~ 1995  . Coronary angioplasty with stent placement  03/2013    "1"  . Left heart catheterization with coronary angiogram N/A 04/22/2013    Procedure:  LEFT HEART CATHETERIZATION WITH CORONARY ANGIOGRAM;  Surgeon: Peter M Martinique, MD;  Location: [facility] CATH LAB;  Service: Cardiovascular;  Laterality: N/A;  . Percutaneous coronary stent intervention (pci-s)  04/22/2013    Procedure: PERCUTANEOUS CORONARY STENT INTERVENTION (PCI-S);  Surgeon: Peter M Martinique, MD;  Location: [facility] CATH LAB;  Service: Cardiovascular;;  . Implantable cardioverter defibrillator implant N/A 09/04/2013    Procedure: SUB Q IMPLANTABLE CARDIOVERTER DEFIBRILLATOR IMPLANT;  Surgeon: Deboraha Sprang, MD;  Location: [facility] CATH LAB;  Service: Cardiovascular;  Laterality: N/A;    History  Smoking status  . Never Smoker   Smokeless tobacco  . Never Used    History  Alcohol Use No    Family History  Problem  Relation Age of Onset  . Colon polyps Mother   . Heart attack Maternal Aunt   . Heart attack Maternal Grandmother   . Heart attack Maternal Grandfather   . Heart attack Paternal Grandfather     Review of Systems: The review of systems is per the HPI.  All other systems were reviewed and are negative.  Physical Exam: BP 110/84 mmHg  Pulse 50  Ht 5\' 1"  (1.549 m)  Wt 52.118 kg (114 lb 14.4 oz)  BMI 21.72 kg/m2 Patient is very pleasant and in no acute distress.  Skin is warm and dry. Color is normal.  HEENT is unremarkable. Normocephalic/atraumatic. PERRL. Sclera are nonicteric. Neck is supple. No masses. No JVD. Lungs are clear. Cardiac exam shows a regular rate and rhythm. Abdomen is soft. Extremities are without edema. Gait and ROM are intact. No gross neurologic deficits noted.  LABORATORY DATA:   Lab Results  Component Value Date   WBC 3.6* 05/12/2014   HGB 13.3 05/12/2014   HCT 39.0 05/12/2014   PLT 186 05/12/2014   GLUCOSE 83 05/12/2014   CHOL 112 05/12/2014   TRIG 52 05/12/2014   HDL 63 05/12/2014   LDLCALC 39 05/12/2014   ALT 20 05/12/2014   AST 21 05/12/2014   NA 140 05/12/2014   K 4.5 05/12/2014   CL 105 05/12/2014   CREATININE 0.64 05/12/2014   BUN 12 05/12/2014   CO2 27 05/12/2014   INR 1.02 04/23/2013   HGBA1C 5.5 04/22/2013    Echo: 07/25/13:Study Conclusions  - Left ventricle: The cavity size was mildly dilated. Wall thickness was normal. Systolic function was severely reduced. The estimated ejection fraction was in the range of 25% to 30%. Akinesis of the mid-apicalanteroseptal myocardium. - Aortic valve: There was trivial regurgitation. - Mitral valve: There was moderate regurgitation. - Tricuspid valve: There was mild-moderate regurgitation. - Pulmonary arteries: Systolic pressure was mildly increased. PA peak pressure: 38 mm Hg (S).  Ecg today shows sinus brady with rate 50. Old anteroseptal MI. No acute change. I have personally reviewed and  interpreted this study.   Labs reviewed from primary care 09/10/14: Normal chemistry panel and CBC. Cholesterol 114, triglycerides 43, HDL 72, LDL 33. UA and TSH normal.   Assessment / Plan:  1. Anterior STEMI - s/p DES to the LAD - residual disease in the ramus but too small to intervene on - she is doing very well clinically. Continue ASA long term.  2. HLD - on Lipitor 40 mg daily. Excellent results.  3. Ischemic CM - EF is low but clinically doing well. Class 1-2.  She is s/p prophylactic ICD. Intolerant of ACEi due to cough. On low dose Coreg and losartan. Titration limited by bradycardia and hypotension. Continue current therapy.  I  will follow up in 6 months.

## 2014-11-10 NOTE — Patient Instructions (Signed)
Continue your current therapy  I willl see you in 6 months.

## 2014-11-12 ENCOUNTER — Encounter: Payer: Self-pay | Admitting: Internal Medicine

## 2014-11-12 ENCOUNTER — Encounter: Payer: Self-pay | Admitting: Cardiology

## 2014-11-12 ENCOUNTER — Ambulatory Visit (INDEPENDENT_AMBULATORY_CARE_PROVIDER_SITE_OTHER): Payer: PRIVATE HEALTH INSURANCE | Admitting: Internal Medicine

## 2014-11-12 VITALS — BP 96/68 | HR 56 | Ht 61.0 in | Wt 114.8 lb

## 2014-11-12 DIAGNOSIS — I5022 Chronic systolic (congestive) heart failure: Secondary | ICD-10-CM | POA: Diagnosis not present

## 2014-11-12 DIAGNOSIS — I255 Ischemic cardiomyopathy: Secondary | ICD-10-CM | POA: Diagnosis not present

## 2014-11-12 LAB — CUP PACEART INCLINIC DEVICE CHECK
Date Time Interrogation Session: 20161013105837
Implantable Lead Location: 753860
Implantable Lead Model: 3400
MDC IDC LEAD IMPLANT DT: 20150806
MDC IDC PG SERIAL: 105131
MDC IDC SET ZONE DETECTION INTERVAL: 300 ms
MDC IDC SET ZONE VENDOR TYPE: 771139
Zone Setting Detection Interval: 250 ms
Zone Setting Vendor Type Category: 771137

## 2014-11-12 NOTE — Progress Notes (Signed)
Patient Care Team: Jefm Petty, MD as PCP - General (Family Medicine)   HPI  Misty Griffin is a 49 y.o. female Seen in follow-up for ICD implantation (S ICD) for primary prevention in the setting of ischemic cardio myopathy  The patient denies chest pain, shortness of breath, nocturnal dyspnea, orthopnea or peripheral edema.  There have been no palpitations, lightheadedness or syncope.    She is accommodating to the protrusion of her defibrillator  Past Medical History  Diagnosis Date  . Hemorrhoids   . Vitamin D deficiency   . CAD (coronary artery disease) 04/22/13    s/p PCI + DES to proximal LAD; severe disease in a small Ramus Branch, not amendable to PCI  . Systolic dysfunction, left ventricle 3/35/15    EF of 25% by TTE  . Diastolic dysfunction 9/52/84    grade I  . ICD-Subcutaneous     BSx  . STEMI (ST elevation myocardial infarction) ([place]) 04/22/13    anterior; s/p PCI to LAD  . Colon polyp     Past Surgical History  Procedure Laterality Date  . Cesarean section  1993; 2000  . Cardiac defibrillator placement  09/04/2013    subcutaneous   . Tubal ligation  2000  . Augmentation mammaplasty Bilateral ~ 1995  . Coronary angioplasty with stent placement  03/2013    "1"  . Left heart catheterization with coronary angiogram N/A 04/22/2013    Procedure: LEFT HEART CATHETERIZATION WITH CORONARY ANGIOGRAM;  Surgeon: Peter M Martinique, MD;  Location: [facility] CATH LAB;  Service: Cardiovascular;  Laterality: N/A;  . Percutaneous coronary stent intervention (pci-s)  04/22/2013    Procedure: PERCUTANEOUS CORONARY STENT INTERVENTION (PCI-S);  Surgeon: Peter M Martinique, MD;  Location: [facility] CATH LAB;  Service: Cardiovascular;;  . Implantable cardioverter defibrillator implant N/A 09/04/2013    Procedure: SUB Q IMPLANTABLE CARDIOVERTER DEFIBRILLATOR IMPLANT;  Surgeon: Deboraha Sprang, MD;  Location: [facility] CATH LAB;  Service: Cardiovascular;  Laterality: N/A;    Current Outpatient  Prescriptions  Medication Sig Dispense Refill  . aspirin 81 MG chewable tablet Chew 1 tablet (81 mg total) by mouth daily.    Marland Kitchen atorvastatin (LIPITOR) 80 MG tablet Take 0.5 tablets (40 mg total) by mouth daily at 6 PM. 30 tablet 6  . carvedilol (COREG) 3.125 MG tablet Take 1 tablet (3.125 mg total) by mouth 2 (two) times daily. 60 tablet 6  . cetirizine (ZYRTEC) 10 MG tablet Take 10 mg by mouth at bedtime as needed for allergies.    . diphenhydrAMINE (BENADRYL) 25 mg capsule Take 25 mg by mouth every 6 (six) hours as needed for allergies.    Marland Kitchen losartan (COZAAR) 25 MG tablet Take 1 tablet (25 mg total) by mouth daily. 90 tablet 3  . magnesium hydroxide (MILK OF MAGNESIA) 400 MG/5ML suspension Take 15-30 mLs by mouth daily as needed for moderate constipation.    . MULTIPLE VITAMIN PO Take 1 tablet by mouth daily.     . nitroGLYCERIN (NITROSTAT) 0.4 MG SL tablet Place 1 tablet (0.4 mg total) under the tongue every 5 (five) minutes x 3 doses as needed for chest pain. 25 tablet 11  . Omega-3 Fatty Acids (FISH OIL) 1000 MG CPDR Take 1,000 mg by mouth daily.    . Wheat Dextrin (BENEFIBER DRINK MIX PO) Take 1 scoop by mouth daily.     No current facility-administered medications for this visit.    Allergies  Allergen Reactions  . Miconazole Nitrate Other (  See Comments)    Abrasion feeling, "sandpaper" feeling    Review of Systems negative except from HPI and PMH  Physical Exam BP 96/68 mmHg  Pulse 56  Ht 5\' 1"  (1.549 m)  Wt 114 lb 12.8 oz (52.073 kg)  BMI 21.70 kg/m2 Well developed and well nourished in no acute distress HENT normal E scleral and icterus clear Neck Supple JVP flat; carotids brisk and full Clear to ausculation Device pocket well healed; without hematoma or erythema.  There is no tethering Regular rate and rhythm, no murmurs gallops or rub Soft with active bowel sounds No clubbing cyanosis  Edema Alert and oriented, grossly normal motor and sensory function Skin Warm  and Dry  noECG  Sinus at 56  14/09/41  Assessment and  Plan  Ischemic cardiomyopathy  Stable continue Guideline directed medical therapy limited by hypotension  Defibrillator implantation-subcutaneous  The patient's device was interrogated.  The information was reviewed. No changes were made in the programming.     Preoperative evaluation  She is scheduled to go explantation of her breast implants. We have discussed defibrillator management and would   recommend placing of a magnet above the device for the procedure duration. It is to be done under local anesthesia. In the event that she were to have a significant arrhythmia, would anticipate that she would lose consciousness, and the device could be reactivated simply by removing the magnet.

## 2014-11-12 NOTE — Patient Instructions (Signed)
Medication Instructions: - no changes  Labwork: - none  Procedures/Testing: - none  Follow-Up: - Remote monitoring is used to monitor your Pacemaker of ICD from home. This monitoring reduces the number of office visits required to check your device to one time per year. It allows [place] to keep an eye on the functioning of your device to ensure it is working properly. You are scheduled for a device check from home on 02/11/15. You may send your transmission at any time that day. If you have a wireless device, the transmission will be sent automatically. After your physician reviews your transmission, you will receive a postcard with your next transmission date.  - Your physician wants you to follow-up in: 1 year with Chanetta Marshall, NP You will receive a reminder letter in the mail two months in advance. If you don't receive a letter, please call our office to schedule the follow-up appointment.  Any Additional Special Instructions Will Be Listed Below (If Applicable). - none

## 2014-11-13 IMAGING — CR DG CHEST 2V
2 series · 2 of 2 positions shown · non-contrast
Comparison: None.

CLINICAL DATA: Acute MI

EXAM:
CHEST  2 VIEW

[w chest pa]
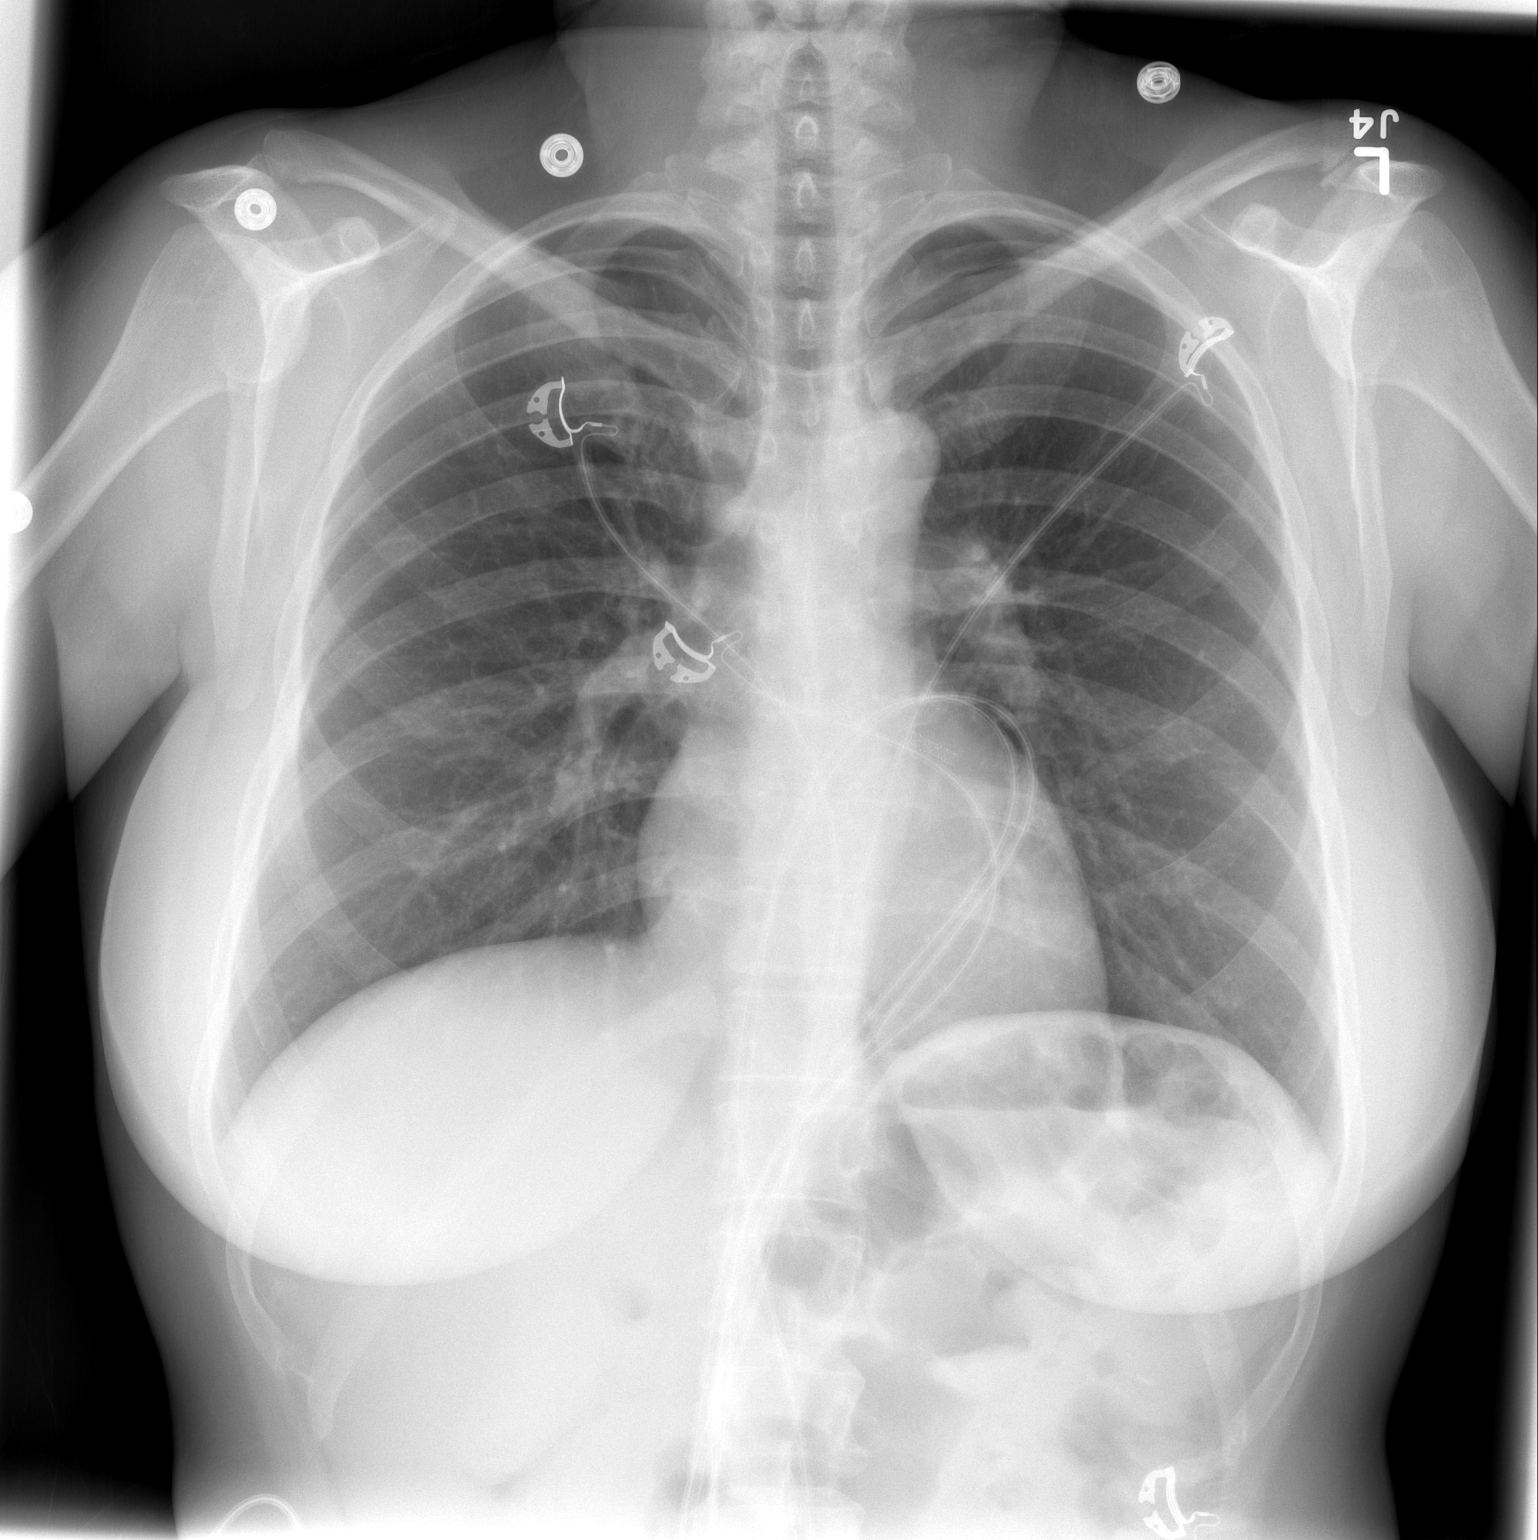

[w chest lat]
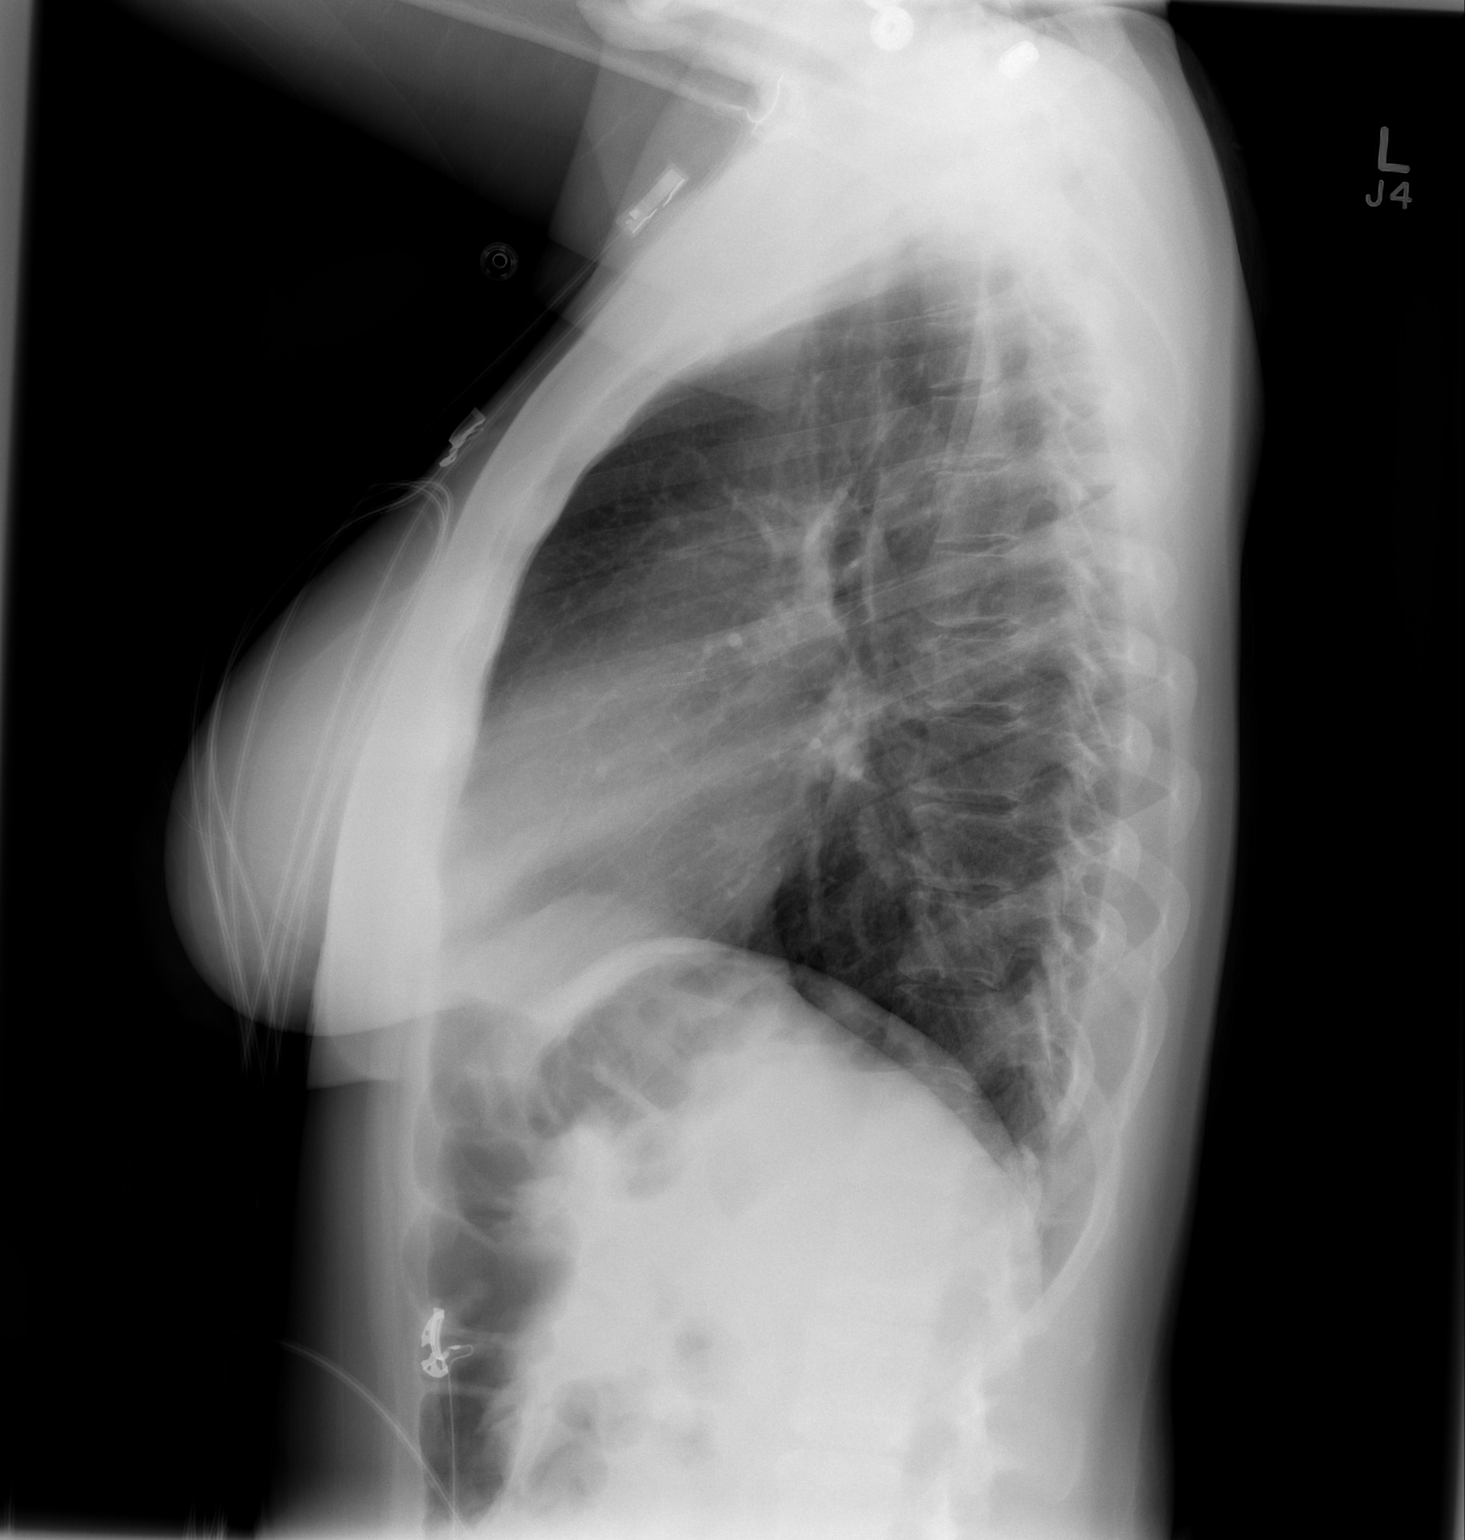

[2 of 2 positions shown; findings below may reference images not displayed]

FINDINGS: The lungs are adequately inflated. There is no focal infiltrate. The
pulmonary vascularity is not engorged. The cardiac silhouette is
normal in size. The mediastinum is normal in width. There is no
pleural effusion. The trachea is midline. The observed portions of
the bony thorax exhibit no acute abnormalities.
IMPRESSION: There is no evidence of CHF nor other active cardiopulmonary
disease.

## 2014-11-24 NOTE — Addendum Note (Signed)
Addended by: Freada Bergeron on: 11/24/2014 10:41 AM   Modules accepted: Orders

## 2015-02-11 ENCOUNTER — Ambulatory Visit (INDEPENDENT_AMBULATORY_CARE_PROVIDER_SITE_OTHER): Payer: PRIVATE HEALTH INSURANCE | Admitting: *Deleted

## 2015-02-11 DIAGNOSIS — I255 Ischemic cardiomyopathy: Secondary | ICD-10-CM | POA: Diagnosis not present

## 2015-02-11 NOTE — Progress Notes (Signed)
Remote ICD transmission.   

## 2015-02-23 LAB — CUP PACEART REMOTE DEVICE CHECK
Implantable Lead Location: 753860
MDC IDC LEAD IMPLANT DT: 20150806
MDC IDC LEAD MODEL: 3400
MDC IDC MSMT BATTERY REMAINING PERCENTAGE: 92 %
MDC IDC SESS DTM: 20170112123700
Pulse Gen Serial Number: 105131

## 2015-02-26 ENCOUNTER — Encounter: Payer: Self-pay | Admitting: Cardiology

## 2015-03-15 ENCOUNTER — Other Ambulatory Visit: Payer: Self-pay | Admitting: *Deleted

## 2015-03-15 MED ORDER — LOSARTAN POTASSIUM 25 MG PO TABS: 25.0000 mg | ORAL_TABLET | Freq: Every day | ORAL | Status: DC

## 2015-03-28 IMAGING — CR DG CHEST 2V
2 series · 2 of 2 positions shown · non-contrast
Comparison: Chest radiograph April 23, 2013

CLINICAL DATA: Post defibrillator implant.

EXAM:
CHEST  2 VIEW

[w chest pa *]
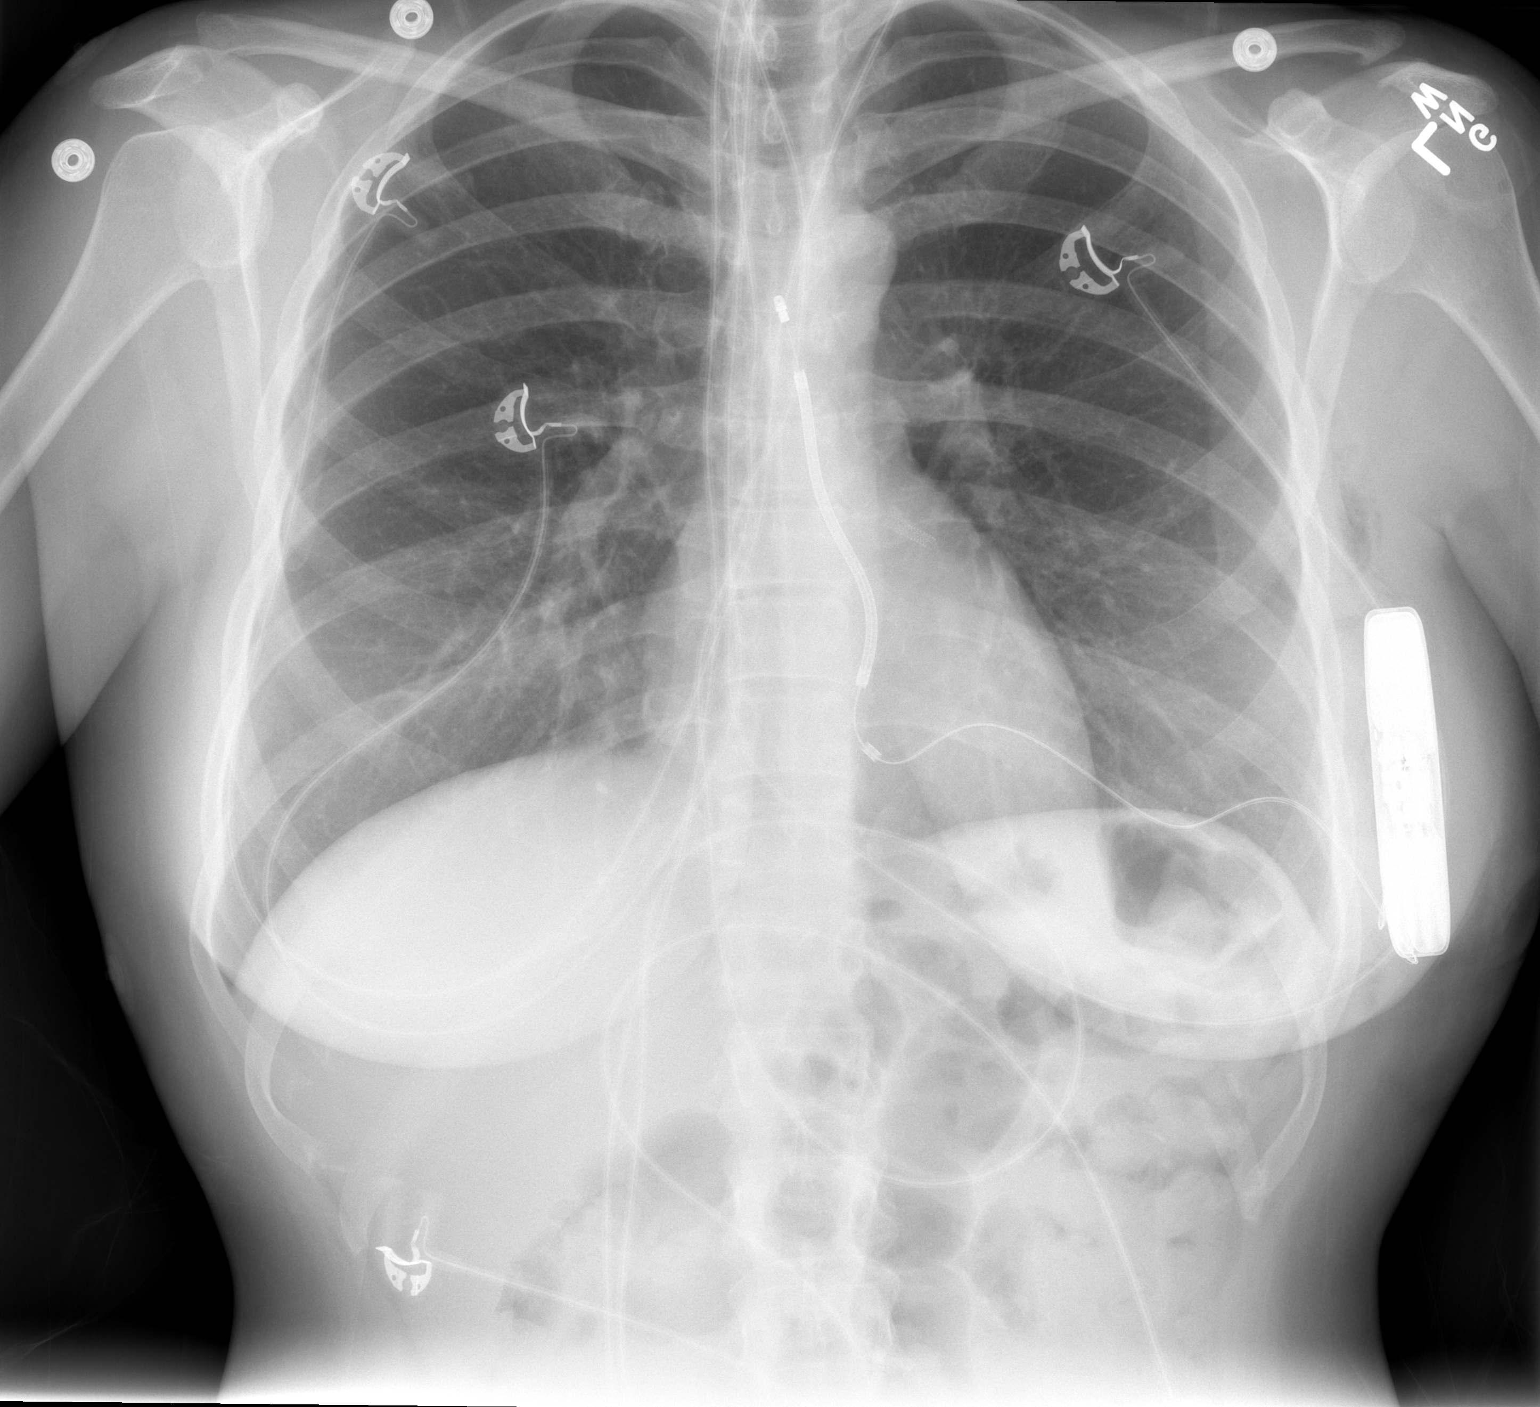

[w chest lat]
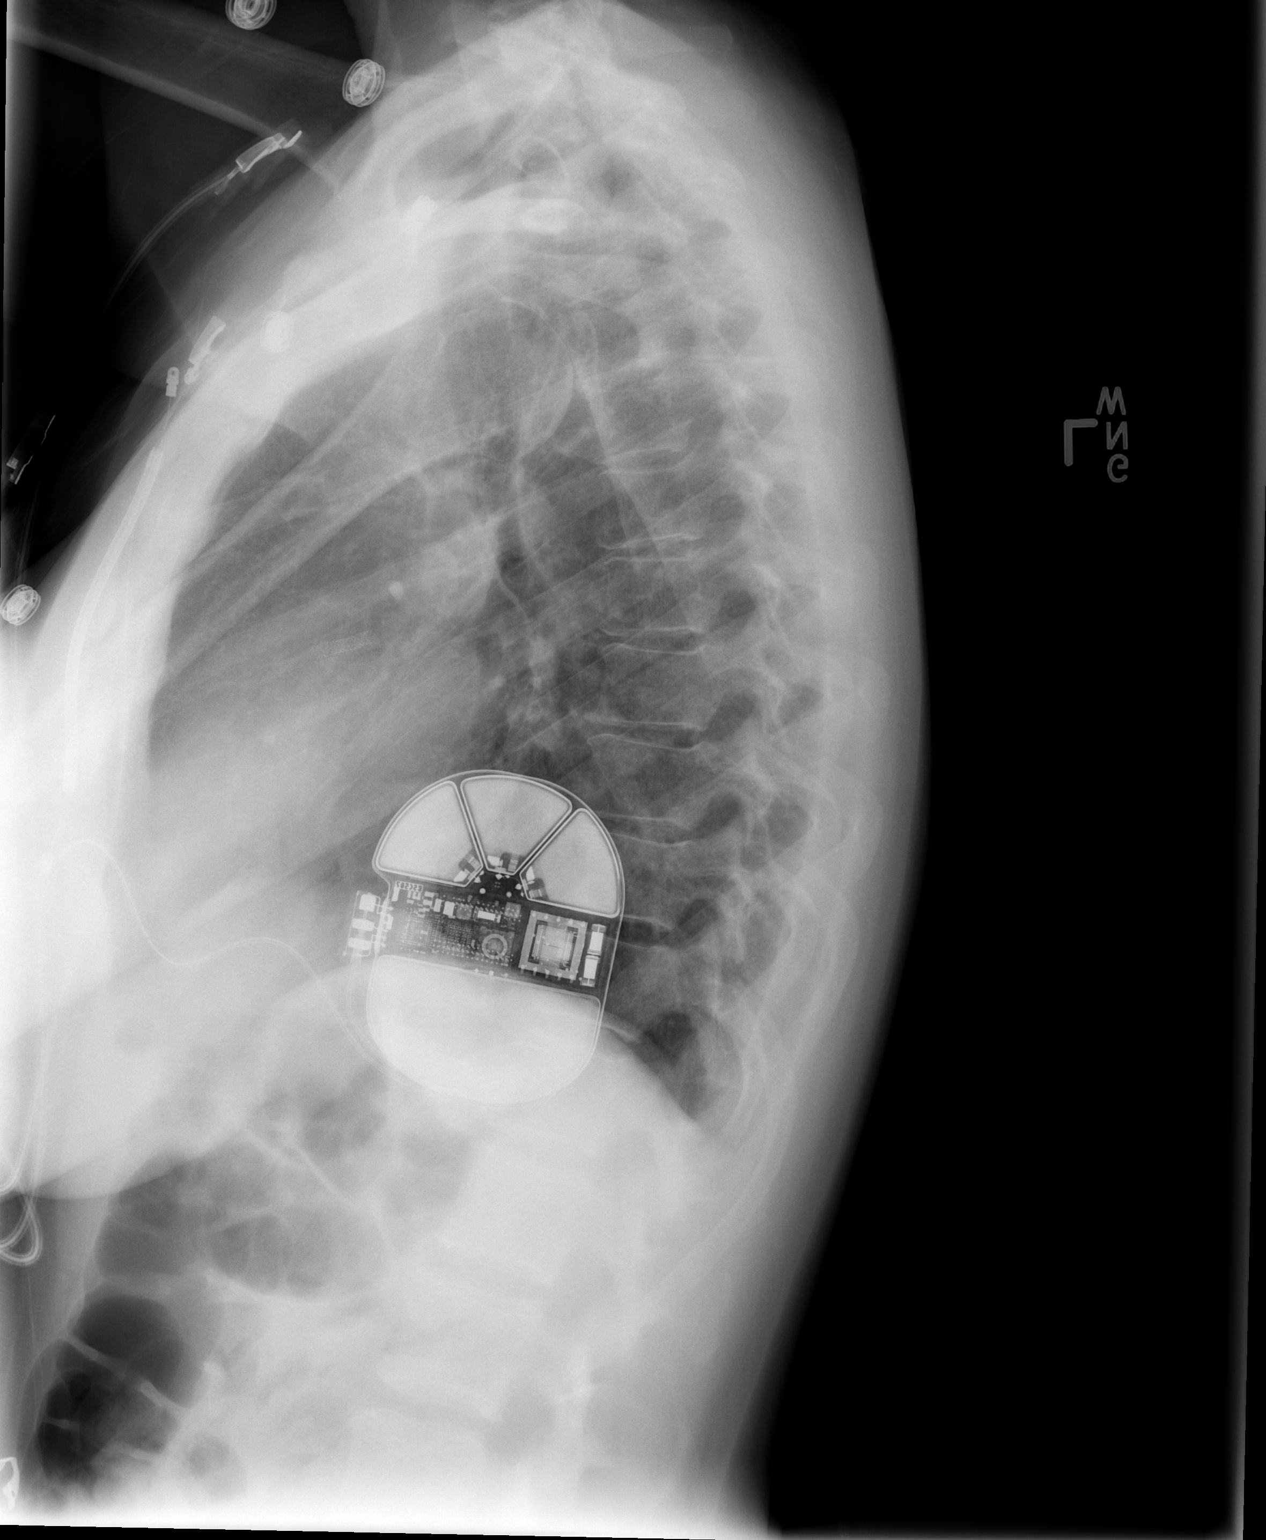

[2 of 2 positions shown; findings below may reference images not displayed]

FINDINGS: Cardiomediastinal silhouette is unremarkable. Defibrillator in left
chest, with lead projecting and anterior chest wall. With single
lead The lungs are clear without pleural effusions or focal
consolidations. Trachea projects midline and there is no
pneumothorax. Soft tissue planes and included osseous structures are
non-suspicious. Lab chest subcutaneous gas consistent with recent
instrumentation. Multiple EKG lines overlie the patient and may
obscure subtle underlying pathology.
IMPRESSION: No acute cardiopulmonary process.

Defibrillator and left chest with lead projecting in anterior chest
wall.

  By: Otana Sukar

## 2015-05-13 ENCOUNTER — Ambulatory Visit (INDEPENDENT_AMBULATORY_CARE_PROVIDER_SITE_OTHER): Payer: PRIVATE HEALTH INSURANCE | Admitting: *Deleted

## 2015-05-13 ENCOUNTER — Telehealth: Payer: Self-pay | Admitting: Cardiology

## 2015-05-13 DIAGNOSIS — I255 Ischemic cardiomyopathy: Secondary | ICD-10-CM

## 2015-05-13 MED ORDER — ATORVASTATIN CALCIUM 80 MG PO TABS: 40.0000 mg | ORAL_TABLET | Freq: Every day | ORAL | Status: DC

## 2015-05-13 MED ORDER — CARVEDILOL 3.125 MG PO TABS: 3.1250 mg | ORAL_TABLET | Freq: Two times a day (BID) | ORAL | Status: DC

## 2015-05-13 MED ORDER — LOSARTAN POTASSIUM 25 MG PO TABS: 25.0000 mg | ORAL_TABLET | Freq: Every day | ORAL | Status: AC

## 2015-05-13 NOTE — Telephone Encounter (Signed)
Per pt call needs refills on:  LIPITOR  CARVEDILOL  LOSARTAN      Per pt Pharmacy called about these refills.

## 2015-05-13 NOTE — Telephone Encounter (Signed)
Rx(s) sent to pharmacy electronically.  

## 2015-05-17 NOTE — Progress Notes (Signed)
Remote ICD transmission.   

## 2015-06-22 LAB — CUP PACEART REMOTE DEVICE CHECK
Date Time Interrogation Session: 20170416132400
Implantable Lead Model: 3400
MDC IDC LEAD IMPLANT DT: 20150806
MDC IDC LEAD LOCATION: 753860
MDC IDC MSMT BATTERY REMAINING PERCENTAGE: 89 %
Pulse Gen Serial Number: 105131

## 2015-06-23 ENCOUNTER — Encounter: Payer: Self-pay | Admitting: Cardiology

## 2015-08-09 ENCOUNTER — Ambulatory Visit (INDEPENDENT_AMBULATORY_CARE_PROVIDER_SITE_OTHER): Payer: PRIVATE HEALTH INSURANCE | Admitting: Cardiology

## 2015-08-09 ENCOUNTER — Encounter: Payer: Self-pay | Admitting: Cardiology

## 2015-08-09 VITALS — BP 125/83 | HR 55 | Ht 61.0 in | Wt 120.0 lb

## 2015-08-09 DIAGNOSIS — I255 Ischemic cardiomyopathy: Secondary | ICD-10-CM

## 2015-08-09 DIAGNOSIS — I5022 Chronic systolic (congestive) heart failure: Secondary | ICD-10-CM

## 2015-08-09 DIAGNOSIS — I2510 Atherosclerotic heart disease of native coronary artery without angina pectoris: Secondary | ICD-10-CM | POA: Diagnosis not present

## 2015-08-09 NOTE — Patient Instructions (Signed)
We will schedule you for an Echocardiogram at [place] your current therapy  I will see you in 6 months.

## 2015-08-09 NOTE — Progress Notes (Signed)
Misty Griffin Date of Birth: 07/04/1965 Medical Record [id number]  History of Present Illness: Misty Griffin is seen back today for follow up CAD. S/p anterior STEMI in March 2015 and had 100% occlusion of the LAD treated with DES. Presentation relatively late. Has severe disease in a very small ramus but too small to intervene on. Statin and beta blocker started.  EF on follow up in June 2015 was 25-30%. Subsequently had ICD implant in July 2015 with Dr. Caryl Comes. No complications. On her prior visit we switched lisinopril to losartan due to cough and this resolved.. On follow up today she states she is doing very well. She is in good spirits.No chest pain or dyspnea. Denies any fluttering symptoms since ICD placed. She exercises daily with walking, running, or stationary bike.  She doesn't check her BP as regularly now but still gets a dip in her BP in the afternoon when she is sitting at her desk. She notes her husband recently had to start on dialysis for longstanding kidney disease.   Current Outpatient Prescriptions  Medication Sig Dispense Refill  . aspirin 81 MG chewable tablet Chew 1 tablet (81 mg total) by mouth daily.    Marland Kitchen atorvastatin (LIPITOR) 80 MG tablet Take 0.5 tablets (40 mg total) by mouth daily at 6 PM. 30 tablet 3  . carvedilol (COREG) 3.125 MG tablet Take 1 tablet (3.125 mg total) by mouth 2 (two) times daily. 60 tablet 5  . cetirizine (ZYRTEC) 10 MG tablet Take 10 mg by mouth at bedtime as needed for allergies.    . diphenhydrAMINE (BENADRYL) 25 mg capsule Take 25 mg by mouth every 6 (six) hours as needed for allergies.    Marland Kitchen losartan (COZAAR) 25 MG tablet Take 1 tablet (25 mg total) by mouth daily. 90 tablet 1  . magnesium hydroxide (MILK OF MAGNESIA) 400 MG/5ML suspension Take 15-30 mLs by mouth daily as needed for moderate constipation.    . MULTIPLE VITAMIN PO Take 1 tablet by mouth daily.     . nitroGLYCERIN (NITROSTAT) 0.4 MG SL tablet Place 1 tablet (0.4 mg total) under the  tongue every 5 (five) minutes x 3 doses as needed for chest pain. 25 tablet 11  . Omega-3 Fatty Acids (FISH OIL) 1000 MG CPDR Take 1,000 mg by mouth daily.    . Wheat Dextrin (BENEFIBER DRINK MIX PO) Take 1 scoop by mouth daily.     No current facility-administered medications for this visit.    Allergies  Allergen Reactions  . Miconazole Nitrate Other (See Comments)    Abrasion feeling, "sandpaper" feeling    Past Medical History  Diagnosis Date  . Hemorrhoids   . Vitamin D deficiency   . CAD (coronary artery disease) 04/22/13    s/p PCI + DES to proximal LAD; severe disease in a small Ramus Branch, not amendable to PCI  . Systolic dysfunction, left ventricle 3/35/15    EF of 25% by TTE  . Diastolic dysfunction [id number]    grade I  . ICD-Subcutaneous     BSx  . STEMI (ST elevation myocardial infarction) ([place]) 04/22/13    anterior; s/p PCI to LAD  . Colon polyp     Past Surgical History  Procedure Laterality Date  . Cesarean section  1993; 2000  . Cardiac defibrillator placement  09/04/2013    subcutaneous   . Tubal ligation  2000  . Augmentation mammaplasty Bilateral ~ 1995  . Coronary angioplasty with stent placement  03/2013    "  1"  . Left heart catheterization with coronary angiogram N/A 04/22/2013    Procedure: LEFT HEART CATHETERIZATION WITH CORONARY ANGIOGRAM;  Surgeon: Torsten Weniger M Martinique, MD;  Location: [facility] CATH LAB;  Service: Cardiovascular;  Laterality: N/A;  . Percutaneous coronary stent intervention (pci-s)  04/22/2013    Procedure: PERCUTANEOUS CORONARY STENT INTERVENTION (PCI-S);  Surgeon: Boris Engelmann M Martinique, MD;  Location: [facility] CATH LAB;  Service: Cardiovascular;;  . Implantable cardioverter defibrillator implant N/A 09/04/2013    Procedure: SUB Q IMPLANTABLE CARDIOVERTER DEFIBRILLATOR IMPLANT;  Surgeon: Deboraha Sprang, MD;  Location: [facility] CATH LAB;  Service: Cardiovascular;  Laterality: N/A;    History  Smoking status  . Never Smoker   Smokeless tobacco  . Never Used     History  Alcohol Use No    Family History  Problem Relation Age of Onset  . Colon polyps Mother   . Heart attack Maternal Aunt   . Heart attack Maternal Grandmother   . Heart attack Maternal Grandfather   . Heart attack Paternal Grandfather     Review of Systems: The review of systems is per the HPI.  All other systems were reviewed and are negative.  Physical Exam: BP 125/83 mmHg  Pulse 55  Ht 5\' 1"  (1.549 m)  Wt 120 lb (54.432 kg)  BMI 22.69 kg/m2 Patient is very pleasant and in no acute distress.  Skin is warm and dry. Color is normal.  HEENT is unremarkable. Normocephalic/atraumatic. PERRL. Sclera are nonicteric. Neck is supple. No masses. No JVD. Lungs are clear. Cardiac exam shows a regular rate and rhythm. Abdomen is soft. Extremities are without edema. Gait and ROM are intact. No gross neurologic deficits noted.  LABORATORY DATA:   Lab Results  Component Value Date   WBC 3.6* 05/12/2014   HGB 13.3 05/12/2014   HCT 39.0 05/12/2014   PLT 186 05/12/2014   GLUCOSE 83 05/12/2014   CHOL 112 05/12/2014   TRIG 52 05/12/2014   HDL 63 05/12/2014   LDLCALC 39 05/12/2014   ALT 20 05/12/2014   AST 21 05/12/2014   NA 140 05/12/2014   K 4.5 05/12/2014   CL 105 05/12/2014   CREATININE 0.64 05/12/2014   BUN 12 05/12/2014   CO2 27 05/12/2014   INR 1.02 04/23/2013   HGBA1C 5.5 04/22/2013     Assessment / Plan:  1. Anterior STEMI - s/p DES to the LAD March 2015 with late presentation - residual disease in the ramus but too small to intervene on - she is doing very well clinically. Continue ASA long term.  2. HLD - on Lipitor 40 mg daily.   3. Ischemic CM - EF is low but clinically doing well. Class 1  She is s/p prophylactic ICD. Intolerant of ACEi due to cough. On low dose Coreg and losartan. Titration limited by bradycardia and hypotension. Not sure she could tolerate Entresto. Continue current therapy. We will update an Echocardiogram. If LV function is worse may  need to try Entresto. Due to financial concerns will order this at [facility] since they are in her network.   4. S/p prophylactic ICD. Normal function by last check in April.   I will follow up in 6 months.

## 2015-08-12 ENCOUNTER — Ambulatory Visit: Payer: PRIVATE HEALTH INSURANCE | Admitting: *Deleted

## 2015-08-12 ENCOUNTER — Telehealth: Payer: Self-pay | Admitting: Cardiology

## 2015-08-12 NOTE — Telephone Encounter (Signed)
LMOVM reminding pt to send remote transmission.   

## 2015-08-13 ENCOUNTER — Encounter: Payer: Self-pay | Admitting: Cardiology

## 2015-08-14 LAB — CUP PACEART REMOTE DEVICE CHECK
Implantable Lead Location: 753860
MDC IDC LEAD IMPLANT DT: 20150806
MDC IDC LEAD MODEL: 3400
MDC IDC MSMT BATTERY REMAINING PERCENTAGE: 87 %
MDC IDC SESS DTM: 20170715170300
Pulse Gen Serial Number: 105131

## 2015-08-16 ENCOUNTER — Ambulatory Visit (INDEPENDENT_AMBULATORY_CARE_PROVIDER_SITE_OTHER): Payer: PRIVATE HEALTH INSURANCE | Admitting: *Deleted

## 2015-08-16 DIAGNOSIS — I255 Ischemic cardiomyopathy: Secondary | ICD-10-CM

## 2015-08-18 ENCOUNTER — Encounter: Payer: Self-pay | Admitting: Cardiology

## 2015-08-18 NOTE — Progress Notes (Signed)
Remote ICD transmission.   

## 2015-08-19 ENCOUNTER — Encounter: Payer: Self-pay | Admitting: Cardiology

## 2015-08-31 ENCOUNTER — Telehealth: Payer: Self-pay

## 2015-08-31 NOTE — Telephone Encounter (Signed)
Spoke to patient Dr.Jordan reviewed recent echo from [facility].EF has improved now 30 to 35 %.

## 2015-09-27 ENCOUNTER — Other Ambulatory Visit: Payer: Self-pay | Admitting: Orthopedic Surgery

## 2015-09-27 ENCOUNTER — Encounter (HOSPITAL_BASED_OUTPATIENT_CLINIC_OR_DEPARTMENT_OTHER): Payer: Self-pay

## 2015-09-27 ENCOUNTER — Encounter (HOSPITAL_BASED_OUTPATIENT_CLINIC_OR_DEPARTMENT_OTHER)
Admission: RE | Disposition: A | Discharge status: Home / Self Care | Payer: Self-pay | Source: Ambulatory Visit | Attending: Orthopedic Surgery

## 2015-09-27 ENCOUNTER — Ambulatory Visit (HOSPITAL_BASED_OUTPATIENT_CLINIC_OR_DEPARTMENT_OTHER)
Admission: RE | Admit: 2015-09-27 | Discharge: 2015-09-27 | Disposition: A | Discharge status: Home / Self Care | Payer: PRIVATE HEALTH INSURANCE | Source: Ambulatory Visit | Attending: Orthopedic Surgery | Admitting: Orthopedic Surgery

## 2015-09-27 DIAGNOSIS — I252 Old myocardial infarction: Secondary | ICD-10-CM | POA: Diagnosis not present

## 2015-09-27 DIAGNOSIS — I2510 Atherosclerotic heart disease of native coronary artery without angina pectoris: Secondary | ICD-10-CM | POA: Diagnosis not present

## 2015-09-27 DIAGNOSIS — L02512 Cutaneous abscess of left hand: Secondary | ICD-10-CM | POA: Diagnosis present

## 2015-09-27 DIAGNOSIS — Z95810 Presence of automatic (implantable) cardiac defibrillator: Secondary | ICD-10-CM | POA: Diagnosis not present

## 2015-09-27 DIAGNOSIS — K5900 Constipation, unspecified: Secondary | ICD-10-CM | POA: Diagnosis not present

## 2015-09-27 DIAGNOSIS — Z79899 Other long term (current) drug therapy: Secondary | ICD-10-CM | POA: Diagnosis not present

## 2015-09-27 DIAGNOSIS — Z7982 Long term (current) use of aspirin: Secondary | ICD-10-CM | POA: Diagnosis not present

## 2015-09-27 DIAGNOSIS — Z955 Presence of coronary angioplasty implant and graft: Secondary | ICD-10-CM | POA: Insufficient documentation

## 2015-09-27 HISTORY — PX: I & D EXTREMITY: SHX5045

## 2015-09-27 SURGERY — MINOR IRRIGATION AND DEBRIDEMENT EXTREMITY
Anesthesia: LOCAL | Site: Thumb | Laterality: Left

## 2015-09-27 MED ORDER — HYDROCODONE-ACETAMINOPHEN 5-325 MG PO TABS: ORAL_TABLET | ORAL | 0 refills | Status: AC

## 2015-09-27 MED ORDER — LIDOCAINE HCL (PF) 1 % IJ SOLN
INTRAMUSCULAR | Status: DC | PRN
  Administered 2015-09-27: 5 mL

## 2015-09-27 MED ORDER — BUPIVACAINE HCL (PF) 0.25 % IJ SOLN
INTRAMUSCULAR | Status: DC | PRN
  Administered 2015-09-27: 5 mL

## 2015-09-27 MED ORDER — CHLORHEXIDINE GLUCONATE 4 % EX LIQD: 60.0000 mL | Freq: Once | CUTANEOUS | Status: DC

## 2015-09-27 SURGICAL SUPPLY — 38 items
BANDAGE COBAN STERILE 2 (GAUZE/BANDAGES/DRESSINGS) IMPLANT
BLADE MINI RND TIP GREEN BEAV (BLADE) IMPLANT
BLADE SURG 15 STRL LF DISP TIS (BLADE) ×2 IMPLANT
BLADE SURG 15 STRL SS (BLADE) ×4
BNDG CMPR 9X4 STRL LF SNTH (GAUZE/BANDAGES/DRESSINGS) ×1
BNDG COHESIVE 1X5 TAN STRL LF (GAUZE/BANDAGES/DRESSINGS) ×1 IMPLANT
BNDG CONFORM 2 STRL LF (GAUZE/BANDAGES/DRESSINGS) IMPLANT
BNDG ELASTIC 2X5.8 VLCR STR LF (GAUZE/BANDAGES/DRESSINGS) IMPLANT
BNDG ESMARK 4X9 LF (GAUZE/BANDAGES/DRESSINGS) ×1 IMPLANT
CHLORAPREP W/TINT 26ML (MISCELLANEOUS) ×1 IMPLANT
CORDS BIPOLAR (ELECTRODE) IMPLANT
COVER BACK TABLE 60X90IN (DRAPES) ×1 IMPLANT
COVER MAYO STAND STRL (DRAPES) ×2 IMPLANT
CUFF TOURNIQUET SINGLE 18IN (TOURNIQUET CUFF) ×2 IMPLANT
DRAIN PENROSE 1/2X12 LTX STRL (WOUND CARE) IMPLANT
DRAIN PENROSE 1/4X12 LTX STRL (WOUND CARE) IMPLANT
DRAPE EXTREMITY T 121X128X90 (DRAPE) IMPLANT
DRAPE SURG 17X23 STRL (DRAPES) ×2 IMPLANT
GAUZE SPONGE 4X4 12PLY STRL (GAUZE/BANDAGES/DRESSINGS) ×2 IMPLANT
GAUZE XEROFORM 1X8 LF (GAUZE/BANDAGES/DRESSINGS) ×2 IMPLANT
GLOVE BIO SURGEON STRL SZ7.5 (GLOVE) ×3 IMPLANT
GLOVE BIOGEL PI IND STRL 8 (GLOVE) ×1 IMPLANT
GLOVE BIOGEL PI INDICATOR 8 (GLOVE) ×2
GOWN STRL REUS W/ TWL LRG LVL3 (GOWN DISPOSABLE) IMPLANT
GOWN STRL REUS W/TWL LRG LVL3 (GOWN DISPOSABLE) ×4
NDL HYPO 25X1 1.5 SAFETY (NEEDLE) ×1 IMPLANT
NEEDLE HYPO 25X1 1.5 SAFETY (NEEDLE) ×2 IMPLANT
NS IRRIG 1000ML POUR BTL (IV SOLUTION) ×2 IMPLANT
PACK BASIN DAY SURGERY FS (CUSTOM PROCEDURE TRAY) IMPLANT
PADDING CAST ABS 4INX4YD NS (CAST SUPPLIES)
PADDING CAST ABS COTTON 4X4 ST (CAST SUPPLIES) ×1 IMPLANT
SPLINT FINGER 3.25 BULB 911905 (SOFTGOODS) ×1 IMPLANT
STOCKINETTE 4X48 STRL (DRAPES) ×2 IMPLANT
SUT ETHILON 4 0 PS 2 18 (SUTURE) IMPLANT
SYR BULB 3OZ (MISCELLANEOUS) IMPLANT
SYR CONTROL 10ML LL (SYRINGE) ×2 IMPLANT
TOWEL OR 17X24 6PK STRL BLUE (TOWEL DISPOSABLE) ×3 IMPLANT
UNDERPAD 30X30 (UNDERPADS AND DIAPERS) ×2 IMPLANT

## 2015-09-27 NOTE — Discharge Instructions (Signed)

## 2015-09-27 NOTE — Op Note (Signed)
[id number] 

## 2015-09-27 NOTE — Brief Op Note (Signed)
09/27/2015  3:04 PM  PATIENT:  Tillie Fantasia  50 y.o. female  PRE-OPERATIVE DIAGNOSIS:  Left thumb infected   POST-OPERATIVE DIAGNOSIS:  Left thumb abscess  PROCEDURE:  Procedure(s): MINOR IRRIGATION AND DEBRIDEMENT LEFT THUMB (Left)  SURGEON:  Surgeon(s) and Role:    * Leanora Cover, MD - Primary  PHYSICIAN ASSISTANT:   ASSISTANTS: none   ANESTHESIA:   local  EBL:  No intake/output data recorded.  BLOOD ADMINISTERED:none  DRAINS: iodoform packing  LOCAL MEDICATIONS USED:  MARCAINE    and LIDOCAINE   SPECIMEN:  Source of Specimen:  left thumb  DISPOSITION OF SPECIMEN:  micro  COUNTS:  YES  TOURNIQUET:    DICTATION: .Other Dictation: Dictation Number [id number]  PLAN OF CARE: Discharge to home after PACU  PATIENT DISPOSITION:  PACU - hemodynamically stable.

## 2015-09-27 NOTE — H&P (Signed)
Misty Griffin is an 50 y.o. female.   Chief Complaint: left thumb infection HPI: 50 yo female with swelling and erythema of left thumb starting two days ago.  No fevers or sweats.  Has had chills.  Does not feel ill.    Allergies:  Allergies  Allergen Reactions  . Miconazole Nitrate Other (See Comments)    Abrasion feeling, "sandpaper" feeling    Past Medical History:  Diagnosis Date  . CAD (coronary artery disease) 04/22/13   s/p PCI + DES to proximal LAD; severe disease in a small Ram[place] Branch, not amendable to PCI  . Colon polyp   . Diastolic dysfunction [id number]   grade I  . Hemorrhoids   . ICD-Subcutaneous    BSx  . STEMI (ST elevation myocardial infarction) ([place]) 04/22/13   anterior; s/p PCI to LAD  . Systolic dysfunction, left ventricle 3/35/15   EF of 25% by TTE  . Vitamin D deficiency     Past Surgical History:  Procedure Laterality Date  . AUGMENTATION MAMMAPLASTY Bilateral ~ 1995  . CARDIAC DEFIBRILLATOR PLACEMENT  09/04/2013   subcutaneous   . [place]; 2000  . CORONARY ANGIOPLASTY WITH STENT PLACEMENT  03/2013   "1"  . IMPLANTABLE CARDIOVERTER DEFIBRILLATOR IMPLANT N/A 09/04/2013   Procedure: SUB Q IMPLANTABLE CARDIOVERTER DEFIBRILLATOR IMPLANT;  Surgeon: Deboraha Sprang, MD;  Location: [facility] CATH LAB;  Service: Cardiovascular;  Laterality: N/A;  . LEFT HEART CATHETERIZATION WITH CORONARY ANGIOGRAM N/A 04/22/2013   Procedure: LEFT HEART CATHETERIZATION WITH CORONARY ANGIOGRAM;  Surgeon: Peter M Martinique, MD;  Location: [facility] CATH LAB;  Service: Cardiovascular;  Laterality: N/A;  . PERCUTANEOUS CORONARY STENT INTERVENTION (PCI-S)  04/22/2013   Procedure: PERCUTANEOUS CORONARY STENT INTERVENTION (PCI-S);  Surgeon: Peter M Martinique, MD;  Location: [facility] CATH LAB;  Service: Cardiovascular;;  . TUBAL LIGATION  2000    Family History: Family History  Problem Relation Age of Onset  . Colon polyps Mother   . Heart attack Maternal Grandmother   . Heart attack Maternal  Grandfather   . Heart attack Paternal Grandfather   . Heart attack Maternal Aunt     Social History:   reports that she has never smoked. She has never used smokeless tobacco. She reports that she does not drink alcohol or use drugs.  Medications: Medications Prior to Admission  Medication Sig Dispense Refill  . aspirin 81 MG chewable tablet Chew 1 tablet (81 mg total) by mouth daily.    Marland Kitchen atorvastatin (LIPITOR) 80 MG tablet Take 0.5 tablets (40 mg total) by mouth daily at 6 PM. 30 tablet 3  . carvedilol (COREG) 3.125 MG tablet Take 1 tablet (3.125 mg total) by mouth 2 (two) times daily. 60 tablet 5  . cetirizine (ZYRTEC) 10 MG tablet Take 10 mg by mouth at bedtime as needed for allergies.    . diphenhydrAMINE (BENADRYL) 25 mg capsule Take 25 mg by mouth every 6 (six) hours as needed for allergies.    Marland Kitchen losartan (COZAAR) 25 MG tablet Take 1 tablet (25 mg total) by mouth daily. 90 tablet 1  . magnesium hydroxide (MILK OF MAGNESIA) 400 MG/5ML suspension Take 15-30 mLs by mouth daily as needed for moderate constipation.    . MULTIPLE VITAMIN PO Take 1 tablet by mouth daily.     . Omega-3 Fatty Acids (FISH OIL) 1000 MG CPDR Take 1,000 mg by mouth daily.    . nitroGLYCERIN (NITROSTAT) 0.4 MG SL tablet Place 1 tablet (0.4 mg total) under  the tongue every 5 (five) minutes x 3 doses as needed for chest pain. 25 tablet 11  . Wheat Dextrin (BENEFIBER DRINK MIX PO) Take 1 scoop by mouth daily.      No results found for this or any previous visit (from the past 48 hour(s)).  No results found.   A comprehensive review of systems was negative except for: Constitutional: positive for chills Gastrointestinal: positive for constipation  Blood pressure 110/79, pulse 65, temperature 98.3 F (36.8 C), temperature source Oral, resp. rate 15, SpO2 99 %.  General appearance: alert, cooperative and appears stated age Head: Normocephalic, without obvious abnormality, atraumatic Neck: supple, symmetrical,  trachea midline Resp: clear to auscultation bilaterally Cardio: regular rate and rhythm GI: non-tender Extremities: Intact sensation and capillary refill all digits.  +epl/fpl/io.  Left thumb with fluctuant area at level of ip joint. Pulses: 2+ and symmetric Skin: Skin color, texture, turgor normal. No rashes or lesions Neurologic: Grossly normal Incision/Wound:none  Assessment/Plan Left thumb abscess.  Recommend OR for incision and drainage.  Risks, benefits, and alternatives of surgery were discussed and the patient agrees with the plan of care.   Toriana Sponsel R 09/27/2015, 1:34 PM

## 2015-09-28 ENCOUNTER — Encounter (HOSPITAL_BASED_OUTPATIENT_CLINIC_OR_DEPARTMENT_OTHER): Payer: Self-pay | Admitting: Orthopedic Surgery

## 2015-09-28 NOTE — Op Note (Signed)
NAMECLORETTA, MOLINAR NO.:  [id number]  MEDICAL RECORD NO.:  [id number]  LOCATION:                                 FACILITY:  PHYSICIAN:  Leanora Cover, MD        DATE OF BIRTH:  1965-09-17  DATE OF PROCEDURE:  09/27/2015 DATE OF DISCHARGE:                              OPERATIVE REPORT   PREOPERATIVE DIAGNOSIS:  Left thumb abscess.  POSTOPERATIVE DIAGNOSIS:  Left thumb abscess.  PROCEDURE:  Incision and drainage of left thumb abscess.  SURGEON:  Leanora Cover, M.D.  ASSISTANT:  None.  ANESTHESIA:  Bier digital block with half and half solution of 1% plain lidocaine and 0.25% plain Marcaine, 10 mL used.  IV FLUIDS:  None.  ESTIMATED BLOOD LOSS:  Minimal.  COMPLICATIONS:  None.  SPECIMENS:  Cultures to Micro.  TOURNIQUET TIME:  9 minutes.  DISPOSITION:  Stable to PACU.  INDICATIONS:  Ms. Adduci is a 50 year old female who has had increasing pain and swelling of the left thumb over the past couple of days.  She has noted a red streak going up into her arm.  She was seen at the office this morning.  I recommended incision and drainage of the abscess.  Risks, benefits, and alternatives of surgery were discussed including risk of blood loss; infection; damage to nerves, vessels, tendons, ligaments, bone, failure of surgery, need for additional surgery complications with wound healing, continued pain, continued infection, need for repeat irrigation and debridement.  She voiced understanding of these risks and elected to proceed.  OPERATIVE COURSE:  After being identified preoperatively by myself, the patient and I agreed upon the procedure and site of procedure.  Surgical site was marked.  Risks, benefits, and alternative surgery were reviewed, and she wished to proceed.  Surgical consent had been signed. She was transferred to the operating room and placed on the operating room table in supine position with the left upper extremity on arm board.   Surgical pause was performed between surgeons, the patient, and operating room staff, and all were in agreement to the patient procedure and site of procedure.  A digital block was performed on the left thumb using 10 mL of half and half solution of 1% plain lidocaine and quarter percent plain Marcaine.  This was adequate to give digital anesthesia to the thumb.  Left upper extremity was prepped and draped in normal sterile orthopedic fashion.  Surgical pause was again performed. Tourniquet at the proximal aspect of the extremity was inflated to 250 mmHg after exsanguination of the limb with Esmarch bandage.  Incision was made over the fluctuant mass at the ulnar side of the dorsal aspect of the IP joint of the thumb.  Gross purulence was encountered. Cultures were taken for aerobes, anaerobes, and Gram stain.  The devitalized skin was sharply debrided with the scissors.  There were intact subcutaneous tissue beneath.  Incision was made through this. Deep to the subcutaneous tissues, there was no gross purulence.  The wound was copiously irrigated with sterile saline.  It was then packed with quarter-inch iodoform gauze, superficial to the tendon, but deep to the subcutaneous tissues.  The wound was dressed with  sterile 4x4s and wrapped with a Coban dressing lightly.  Alumafoam splint was placed and wrapped lightly with the Coban dressing.  Tourniquet was deflated at 9 minutes.  Fingertips were pink with brisk capillary refill after deflation of tourniquet.  Operative drapes were broken down.  The patient was transferred back to recovery.  She tolerated procedure well. I will see her back in the office in 3 days for wound care and 4 days for postop check with myself.  I will give her Norco 5/325 one to two p.o. q.6 hours p.r.n. pain, dispensed #20 and Bactrim DS 1 p.o. b.i.d. x7 days.  The subcutaneous tissues were sharply debrided also using the scissors.     Leanora Cover,  MD   ______________________________ Leanora Cover, MD    KK/MEDQ  D:  09/27/2015  T:  09/28/2015  Job:  [id number]

## 2015-10-06 LAB — AEROBIC/ANAEROBIC CULTURE W GRAM STAIN (SURGICAL/DEEP WOUND)

## 2015-10-06 LAB — AEROBIC/ANAEROBIC CULTURE (SURGICAL/DEEP WOUND)

## 2015-10-28 ENCOUNTER — Telehealth: Payer: Self-pay

## 2015-10-28 NOTE — Telephone Encounter (Signed)
Pt called requesting to discontinue remote checks r/t inability to afford charges. I explained that if we discontinue remotes we will need to check her device in the office every three months. She is concerned that this would also incur additional charges. At this point she will maintain ROV with [facility] on 12/5 and discuss possible transfer to [facility].

## 2015-11-10 ENCOUNTER — Other Ambulatory Visit: Payer: Self-pay | Admitting: Cardiology

## 2015-12-04 ENCOUNTER — Other Ambulatory Visit: Payer: Self-pay | Admitting: Cardiology

## 2015-12-28 ENCOUNTER — Encounter: Payer: Self-pay | Admitting: Internal Medicine

## 2016-01-04 ENCOUNTER — Encounter: Payer: PRIVATE HEALTH INSURANCE | Admitting: Internal Medicine

## 2016-01-19 ENCOUNTER — Other Ambulatory Visit: Payer: Self-pay | Admitting: Cardiology

## 2016-03-24 ENCOUNTER — Encounter: Payer: Self-pay | Admitting: Cardiology

## 2016-04-03 ENCOUNTER — Ambulatory Visit (INDEPENDENT_AMBULATORY_CARE_PROVIDER_SITE_OTHER): Payer: PRIVATE HEALTH INSURANCE | Admitting: *Deleted

## 2016-04-03 DIAGNOSIS — I255 Ischemic cardiomyopathy: Secondary | ICD-10-CM

## 2016-04-03 NOTE — Progress Notes (Signed)
Remote ICD transmission.   

## 2016-04-04 ENCOUNTER — Encounter: Payer: Self-pay | Admitting: Cardiology

## 2016-04-04 LAB — CUP PACEART REMOTE DEVICE CHECK
Battery Remaining Percentage: 79 %
Date Time Interrogation Session: 20180304001400
Implantable Lead Implant Date: 20150806
Implantable Lead Location: 753860
Implantable Lead Model: 3400
Implantable Pulse Generator Implant Date: 20150806
Pulse Gen Serial Number: 105131

## 2016-04-27 ENCOUNTER — Telehealth: Payer: Self-pay | Admitting: Cardiology

## 2016-05-11 NOTE — Telephone Encounter (Signed)
Close encounter 

## 2016-07-03 ENCOUNTER — Telehealth: Payer: Self-pay | Admitting: Cardiology

## 2016-07-03 ENCOUNTER — Encounter: Payer: PRIVATE HEALTH INSURANCE | Admitting: *Deleted

## 2016-07-03 NOTE — Telephone Encounter (Signed)
Spoke with pt and reminded pt of remote transmission that is due today. 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 Informed pt that I would make sure she no longer is charged or scheduled for our office. Informed pt that I could not do anything about the retroactive charges. Pt verbalized understanding.

## 2016-07-07 ENCOUNTER — Encounter: Payer: Self-pay | Admitting: Cardiology

## 2019-11-04 ENCOUNTER — Ambulatory Visit: Payer: Self-pay
